# Patient Record
Sex: Male | Born: 1955 | Race: White | Hispanic: No | Marital: Married | Smoking: Never smoker
Health system: Southern US, Community
[De-identification: ages and names within clinical notes are randomized; demographics above are authoritative.]

## PROBLEM LIST (undated history)

## (undated) DIAGNOSIS — IMO0002 Reserved for concepts with insufficient information to code with codable children: Secondary | ICD-10-CM

## (undated) DIAGNOSIS — I10 Essential (primary) hypertension: Secondary | ICD-10-CM

## (undated) DIAGNOSIS — K5641 Fecal impaction: Secondary | ICD-10-CM

## (undated) HISTORY — DX: Reserved for concepts with insufficient information to code with codable children: IMO0002

---

## 2008-12-25 DIAGNOSIS — K5641 Fecal impaction: Secondary | ICD-10-CM

## 2008-12-25 HISTORY — DX: Fecal impaction: K56.41

## 2012-03-13 ENCOUNTER — Encounter (INDEPENDENT_AMBULATORY_CARE_PROVIDER_SITE_OTHER): Payer: Self-pay | Admitting: Surgery

## 2012-03-25 DIAGNOSIS — IMO0002 Reserved for concepts with insufficient information to code with codable children: Secondary | ICD-10-CM

## 2012-03-25 HISTORY — DX: Reserved for concepts with insufficient information to code with codable children: IMO0002

## 2012-04-03 ENCOUNTER — Ambulatory Visit (INDEPENDENT_AMBULATORY_CARE_PROVIDER_SITE_OTHER): Payer: BC Managed Care – PPO | Admitting: Surgery

## 2012-04-08 ENCOUNTER — Ambulatory Visit (INDEPENDENT_AMBULATORY_CARE_PROVIDER_SITE_OTHER): Payer: BC Managed Care – PPO | Admitting: Surgery

## 2012-04-08 ENCOUNTER — Encounter (INDEPENDENT_AMBULATORY_CARE_PROVIDER_SITE_OTHER): Payer: Self-pay | Admitting: Surgery

## 2012-04-08 VITALS — BP 158/98 | HR 72 | Temp 98.5°F | Resp 16 | Ht 69.0 in | Wt 220.8 lb

## 2012-04-08 DIAGNOSIS — IMO0002 Reserved for concepts with insufficient information to code with codable children: Secondary | ICD-10-CM | POA: Insufficient documentation

## 2012-04-08 DIAGNOSIS — L723 Sebaceous cyst: Secondary | ICD-10-CM

## 2012-04-08 NOTE — Progress Notes (Signed)
Subjective:     Patient ID: Joshua Rivas, male   DOB: November 21, 1956, 56 y.o.   MRN: [id number]  HPI He is referred by Dr. Eula Listen for evaluation of a cyst in the left groin. It appeared to proximal. It was painful but is now totally resolved since he saw Dr. Eula Listen. He now has no pain. He denies any trauma. He denies any previous cysts  Review of Systems     Objective:   Physical Exam On exam, there is a small scar in the groin but no palpable cyst or evidence of infection    Assessment:     Resolved left groin cyst    Plan:     As the cyst is no longer present, I will hold on excising the area and less it recurs. I explained the diagnosis to him in detail. He is in agreement with this plan. I will see him back as needed

## 2012-10-06 ENCOUNTER — Encounter (HOSPITAL_COMMUNITY): Payer: Self-pay | Admitting: *Deleted

## 2012-10-06 ENCOUNTER — Emergency Department (HOSPITAL_COMMUNITY)
Admission: EM | Admit: 2012-10-06 | Discharge: 2012-10-06 | Disposition: A | Discharge status: Home / Self Care | Payer: BC Managed Care – PPO | Attending: Emergency Medicine | Admitting: Emergency Medicine

## 2012-10-06 ENCOUNTER — Emergency Department (HOSPITAL_COMMUNITY): Payer: BC Managed Care – PPO

## 2012-10-06 DIAGNOSIS — R1013 Epigastric pain: Secondary | ICD-10-CM | POA: Insufficient documentation

## 2012-10-06 DIAGNOSIS — R112 Nausea with vomiting, unspecified: Secondary | ICD-10-CM | POA: Insufficient documentation

## 2012-10-06 DIAGNOSIS — R109 Unspecified abdominal pain: Secondary | ICD-10-CM

## 2012-10-06 LAB — COMPREHENSIVE METABOLIC PANEL
ALT: 21 U/L (ref 0–53)
AST: 21 U/L (ref 0–37)
Albumin: 3.7 g/dL (ref 3.5–5.2)
CO2: 26 mEq/L (ref 19–32)
Calcium: 9.1 mg/dL (ref 8.4–10.5)
Creatinine, Ser: 1.08 mg/dL (ref 0.50–1.35)
Sodium: 137 mEq/L (ref 135–145)

## 2012-10-06 LAB — URINALYSIS, ROUTINE W REFLEX MICROSCOPIC
Glucose, UA: NEGATIVE mg/dL
Leukocytes, UA: NEGATIVE
Nitrite: NEGATIVE
Specific Gravity, Urine: 1.034 — ABNORMAL HIGH (ref 1.005–1.030)
pH: 5 (ref 5.0–8.0)

## 2012-10-06 LAB — CBC WITH DIFFERENTIAL/PLATELET
Basophils Absolute: 0 10*3/uL (ref 0.0–0.1)
Basophils Relative: 0 % (ref 0–1)
Eosinophils Relative: 0 % (ref 0–5)
HCT: 47.7 % (ref 39.0–52.0)
Lymphocytes Relative: 18 % (ref 12–46)
MCHC: 34 g/dL (ref 30.0–36.0)
MCV: 89.5 fL (ref 78.0–100.0)
Monocytes Absolute: 0.8 10*3/uL (ref 0.1–1.0)
Neutro Abs: 7.6 10*3/uL (ref 1.7–7.7)
Platelets: 247 10*3/uL (ref 150–400)
RDW: 12.3 % (ref 11.5–15.5)
WBC: 10.3 10*3/uL (ref 4.0–10.5)

## 2012-10-06 LAB — TROPONIN I: Troponin I: <0.3 ng/mL (ref <0.30)

## 2012-10-06 MED ORDER — SUCRALFATE 1 GM/10ML PO SUSP: 1.0000 g | Freq: Four times a day (QID) | ORAL | Status: DC | PRN

## 2012-10-06 MED ORDER — GI COCKTAIL ~~LOC~~
30.0000 mL | Freq: Once | ORAL | Status: AC
  Administered 2012-10-06: 30 mL via ORAL
  Filled 2012-10-06: qty 30

## 2012-10-06 MED ORDER — HYDROMORPHONE HCL PF 1 MG/ML IJ SOLN
1.0000 mg | Freq: Once | INTRAMUSCULAR | Status: AC
  Administered 2012-10-06: 1 mg via INTRAVENOUS
  Filled 2012-10-06: qty 1

## 2012-10-06 MED ORDER — ONDANSETRON HCL 4 MG/2ML IJ SOLN
4.0000 mg | Freq: Once | INTRAMUSCULAR | Status: AC
  Administered 2012-10-06: 4 mg via INTRAVENOUS
  Filled 2012-10-06: qty 2

## 2012-10-06 MED ORDER — IOHEXOL 300 MG/ML  SOLN
100.0000 mL | Freq: Once | INTRAMUSCULAR | Status: AC | PRN
  Administered 2012-10-06: 100 mL via INTRAVENOUS

## 2012-10-06 MED ORDER — SODIUM CHLORIDE 0.9 % IV BOLUS (SEPSIS)
1000.0000 mL | Freq: Once | INTRAVENOUS | Status: AC
  Administered 2012-10-06: 1000 mL via INTRAVENOUS

## 2012-10-06 MED ORDER — PANTOPRAZOLE SODIUM 20 MG PO TBEC: 20.0000 mg | DELAYED_RELEASE_TABLET | Freq: Every day | ORAL | Status: DC

## 2012-10-06 NOTE — ED Notes (Signed)
Bed:WA16<BR> Expected date:<BR> Expected time:<BR> Means of arrival:<BR> Comments:<BR> EMS

## 2012-10-06 NOTE — ED Notes (Signed)
Patient is alert and oriented x3.  He is complaining of abdominal pain that started last night after Eating.  He states his pain is a 8 of 10 that radiates straight to his back.

## 2012-10-06 NOTE — ED Provider Notes (Signed)
History     CSN: [id number]  Arrival date & time 10/06/12  0054   First MD Initiated Contact with Patient 10/06/12 0135      Chief Complaint  Patient presents with  . Abdominal Pain    (Consider location/radiation/quality/duration/timing/severity/associated sxs/prior treatment) HPI The patient presents after the sub-acute onset of epigastric pain.  He recalls eating dinner, having half of an alcoholic beverage, and eating some peanuts and subsequently developing sharp pain in his epigastrium.  The pain is nonradiating.  Since onset the pain has been persistent, though minimally better after vomiting.  She notes concurrent nausea, generalized sense of discomfort.  He denies chest pain, dyspnea, fevers, chills. The pain radiates to his back. No relief with OTC antacids. The patient was in his usual state of health until this occurred. Past Medical History  Diagnosis Date  . Cyst     Left groin    History reviewed. No pertinent past surgical history.  History reviewed. No pertinent family history.  History  Substance Use Topics  . Smoking status: Never Smoker   . Smokeless tobacco: Not on file  . Alcohol Use: Yes      Review of Systems  Constitutional:       Per HPI, otherwise negative  HENT:       Per HPI, otherwise negative  Eyes: Negative.   Respiratory:       Per HPI, otherwise negative  Cardiovascular:       Per HPI, otherwise negative  Gastrointestinal: Positive for nausea, vomiting and abdominal pain. Negative for diarrhea.  Genitourinary: Negative.   Musculoskeletal:       Per HPI, otherwise negative  Skin: Negative.   Neurological: Negative for syncope.    Allergies  Review of patient's allergies indicates no known allergies.  Home Medications   Current Outpatient Rx  Name Route Sig Dispense Refill  . ACETAMINOPHEN 325 MG PO TABS Oral Take 650 mg by mouth every 6 (six) hours as needed.    Marland Kitchen BISMUTH SUBSALICYLATE 262 MG PO CHEW Oral Chew 524 mg  by mouth as needed. Stomach pain      BP 148/82  Pulse 86  Temp 97.6 F (36.4 C) (Oral)  Resp 18  SpO2 100%  Physical Exam  Nursing note and vitals reviewed. Constitutional: He is oriented to person, place, and time. He appears well-developed. No distress.  HENT:  Head: Normocephalic and atraumatic.  Eyes: Conjunctivae normal and EOM are normal.  Cardiovascular: Normal rate and regular rhythm.   Pulmonary/Chest: Effort normal. No stridor. No respiratory distress.  Abdominal: He exhibits no distension. There is tenderness in the epigastric area. There is no rigidity and no rebound.  Musculoskeletal: He exhibits no edema.  Neurological: He is alert and oriented to person, place, and time.  Skin: Skin is warm and dry.  Psychiatric: He has a normal mood and affect.    ED Course  Procedures (including critical care time)   Labs Reviewed  CBC WITH DIFFERENTIAL  COMPREHENSIVE METABOLIC PANEL  LIPASE, BLOOD  TROPONIN I  URINALYSIS, ROUTINE W REFLEX MICROSCOPIC   No results found.   No diagnosis found.  Cardiac 80 sinus rhythm normal Pulse ox 99% room air normal  3:26 AM Patient continues to c/o pain.  CT scan ordered.   Date: 10/06/2012  Rate: 84  Rhythm: normal sinus rhythm  QRS Axis: normal  Intervals: normal  ST/T Wave abnormalities: normal  Conduction Disutrbances:none  Narrative Interpretation:   Old EKG Reviewed: none available  NORMAL  ECG    6:22 AM The patient seems comfortable, resting, no apparent distress.  Vital signs remained stable. MDM  This previously well male presents with new acute onset abdominal pain.  The pain is epigastric, with associated nausea and vomiting.  Given the patient's description of intake of food and alcohol prior to the onset of symptoms or suspicion of gastroesophageal irritation.  However, given the absence of significant improvement following initial resuscitative efforts, the patient had a CAT scan.  This scan as well  as the patient's respiratory results were all largely reassuring.  The resolution of symptoms was similarly reassuring.  Absent any chest pain, dyspnea, lightheadedness, persistent nausea and vomiting there is low suspicion for acute ongoing cardiac etiology.  After a prolonged discussion on the need for ongoing primary and possible specialist care, the patient was discharged in stable condition with new PPI, topical analgesic.  Gerhard Munch, MD 10/06/12 [phone]

## 2012-10-11 ENCOUNTER — Emergency Department (HOSPITAL_COMMUNITY): Payer: BC Managed Care – PPO

## 2012-10-11 ENCOUNTER — Encounter (HOSPITAL_COMMUNITY): Payer: Self-pay | Admitting: *Deleted

## 2012-10-11 ENCOUNTER — Encounter (HOSPITAL_COMMUNITY): Payer: Self-pay | Admitting: Anesthesiology

## 2012-10-11 ENCOUNTER — Inpatient Hospital Stay (HOSPITAL_COMMUNITY)
Admission: EM | Admit: 2012-10-11 | Discharge: 2012-10-16 | DRG: 198 | Disposition: A | Discharge status: Home / Self Care | Payer: BC Managed Care – PPO | Attending: Surgery | Admitting: Surgery

## 2012-10-11 ENCOUNTER — Inpatient Hospital Stay (HOSPITAL_COMMUNITY): Payer: BC Managed Care – PPO

## 2012-10-11 ENCOUNTER — Encounter (HOSPITAL_COMMUNITY): Admission: EM | Disposition: A | Discharge status: Home / Self Care | Payer: Self-pay | Source: Home / Self Care

## 2012-10-11 ENCOUNTER — Inpatient Hospital Stay (HOSPITAL_COMMUNITY): Payer: BC Managed Care – PPO | Admitting: Anesthesiology

## 2012-10-11 DIAGNOSIS — K8000 Calculus of gallbladder with acute cholecystitis without obstruction: Secondary | ICD-10-CM

## 2012-10-11 DIAGNOSIS — K8010 Calculus of gallbladder with chronic cholecystitis without obstruction: Secondary | ICD-10-CM

## 2012-10-11 DIAGNOSIS — Z5331 Laparoscopic surgical procedure converted to open procedure: Secondary | ICD-10-CM

## 2012-10-11 DIAGNOSIS — K819 Cholecystitis, unspecified: Secondary | ICD-10-CM

## 2012-10-11 HISTORY — PX: CHOLECYSTECTOMY: SHX55

## 2012-10-11 HISTORY — DX: Reserved for concepts with insufficient information to code with codable children: IMO0002

## 2012-10-11 HISTORY — DX: Fecal impaction: K56.41

## 2012-10-11 LAB — COMPREHENSIVE METABOLIC PANEL
ALT: 16 U/L (ref 0–53)
AST: 15 U/L (ref 0–37)
Albumin: 3.2 g/dL — ABNORMAL LOW (ref 3.5–5.2)
Alkaline Phosphatase: 85 U/L (ref 39–117)
CO2: 23 mEq/L (ref 19–32)
Chloride: 102 mEq/L (ref 96–112)
Creatinine, Ser: 0.97 mg/dL (ref 0.50–1.35)
Potassium: 3.8 mEq/L (ref 3.5–5.1)
Sodium: 136 mEq/L (ref 135–145)
Total Bilirubin: 0.5 mg/dL (ref 0.3–1.2)

## 2012-10-11 LAB — CBC WITH DIFFERENTIAL/PLATELET
HCT: 44.3 % (ref 39.0–52.0)
Hemoglobin: 15.2 g/dL (ref 13.0–17.0)
Lymphocytes Relative: 12 % (ref 12–46)
MCHC: 34.3 g/dL (ref 30.0–36.0)
MCV: 89.3 fL (ref 78.0–100.0)
Monocytes Absolute: 1.3 10*3/uL — ABNORMAL HIGH (ref 0.1–1.0)
Monocytes Relative: 10 % (ref 3–12)
Neutro Abs: 10 10*3/uL — ABNORMAL HIGH (ref 1.7–7.7)
WBC: 13.3 10*3/uL — ABNORMAL HIGH (ref 4.0–10.5)

## 2012-10-11 LAB — URINALYSIS, ROUTINE W REFLEX MICROSCOPIC
Glucose, UA: NEGATIVE mg/dL
Hgb urine dipstick: NEGATIVE
Leukocytes, UA: NEGATIVE
pH: 5.5 (ref 5.0–8.0)

## 2012-10-11 SURGERY — CHOLECYSTECTOMY
Anesthesia: General | Site: Abdomen | Wound class: Contaminated

## 2012-10-11 MED ORDER — LACTATED RINGERS IV SOLN
INTRAVENOUS | Status: DC
Start: 2012-10-11 — End: 2012-10-11

## 2012-10-11 MED ORDER — HYDROMORPHONE 0.3 MG/ML IV SOLN
INTRAVENOUS | Status: AC
  Administered 2012-10-11: 0.3 mg via INTRAVENOUS
  Filled 2012-10-11: qty 25

## 2012-10-11 MED ORDER — ONDANSETRON HCL 4 MG/2ML IJ SOLN: 4.0000 mg | Freq: Four times a day (QID) | INTRAMUSCULAR | Status: DC | PRN

## 2012-10-11 MED ORDER — MORPHINE SULFATE 2 MG/ML IJ SOLN: 1.0000 mg | INTRAMUSCULAR | Status: DC | PRN

## 2012-10-11 MED ORDER — HYDROMORPHONE HCL PF 1 MG/ML IJ SOLN
0.2500 mg | INTRAMUSCULAR | Status: DC | PRN
  Administered 2012-10-11: 0.25 mg via INTRAVENOUS
  Administered 2012-10-11: 0.5 mg via INTRAVENOUS
  Administered 2012-10-11: 0.25 mg via INTRAVENOUS
  Administered 2012-10-11: 0.5 mg via INTRAVENOUS

## 2012-10-11 MED ORDER — SODIUM CHLORIDE 0.9 % IV SOLN
1.0000 g | INTRAVENOUS | Status: DC
  Administered 2012-10-11 – 2012-10-14 (×4): 1 g via INTRAVENOUS
  Filled 2012-10-11 (×6): qty 1

## 2012-10-11 MED ORDER — HYDROMORPHONE HCL PF 1 MG/ML IJ SOLN
INTRAMUSCULAR | Status: AC
  Filled 2012-10-11: qty 1

## 2012-10-11 MED ORDER — HYDROMORPHONE HCL PF 1 MG/ML IJ SOLN
INTRAMUSCULAR | Status: DC | PRN
  Administered 2012-10-11: 0.5 mg via INTRAVENOUS
  Administered 2012-10-11: .5 mg via INTRAVENOUS
  Administered 2012-10-11 (×2): 0.5 mg via INTRAVENOUS

## 2012-10-11 MED ORDER — PROPOFOL 10 MG/ML IV BOLUS
INTRAVENOUS | Status: DC | PRN
  Administered 2012-10-11: 180 mg via INTRAVENOUS

## 2012-10-11 MED ORDER — BUPIVACAINE-EPINEPHRINE 0.25% -1:200000 IJ SOLN
INTRAMUSCULAR | Status: DC | PRN
  Administered 2012-10-11: 24 mL

## 2012-10-11 MED ORDER — IOHEXOL 300 MG/ML  SOLN
INTRAMUSCULAR | Status: AC
  Filled 2012-10-11: qty 1

## 2012-10-11 MED ORDER — LACTATED RINGERS IR SOLN
Status: DC | PRN
  Administered 2012-10-11: 1000 mL

## 2012-10-11 MED ORDER — CEFAZOLIN SODIUM 1-5 GM-% IV SOLN
INTRAVENOUS | Status: AC
  Filled 2012-10-11: qty 50

## 2012-10-11 MED ORDER — MIDAZOLAM HCL 5 MG/5ML IJ SOLN
INTRAMUSCULAR | Status: DC | PRN
  Administered 2012-10-11: 2 mg via INTRAVENOUS

## 2012-10-11 MED ORDER — SODIUM CHLORIDE 0.9 % IV SOLN
1000.0000 mL | INTRAVENOUS | Status: DC
  Administered 2012-10-11: 1000 mL via INTRAVENOUS

## 2012-10-11 MED ORDER — ONDANSETRON HCL 4 MG PO TABS: 4.0000 mg | ORAL_TABLET | Freq: Four times a day (QID) | ORAL | Status: DC | PRN

## 2012-10-11 MED ORDER — LACTATED RINGERS IV SOLN
INTRAVENOUS | Status: DC
  Administered 2012-10-11 (×2): via INTRAVENOUS
  Administered 2012-10-11: 1000 mL via INTRAVENOUS

## 2012-10-11 MED ORDER — 0.9 % SODIUM CHLORIDE (POUR BTL) OPTIME
TOPICAL | Status: DC | PRN
  Administered 2012-10-11 (×3): 1000 mL

## 2012-10-11 MED ORDER — PROMETHAZINE HCL 25 MG/ML IJ SOLN: 6.2500 mg | INTRAMUSCULAR | Status: DC | PRN

## 2012-10-11 MED ORDER — ONDANSETRON HCL 4 MG/2ML IJ SOLN
INTRAMUSCULAR | Status: DC | PRN
  Administered 2012-10-11: 4 mg via INTRAVENOUS

## 2012-10-11 MED ORDER — CHLORHEXIDINE GLUCONATE CLOTH 2 % EX PADS: 6.0000 | MEDICATED_PAD | Freq: Every day | CUTANEOUS | Status: DC

## 2012-10-11 MED ORDER — ACETAMINOPHEN 10 MG/ML IV SOLN
INTRAVENOUS | Status: DC | PRN
  Administered 2012-10-11: 1000 mg via INTRAVENOUS

## 2012-10-11 MED ORDER — CEFAZOLIN SODIUM 1-5 GM-% IV SOLN
1.0000 g | Freq: Three times a day (TID) | INTRAVENOUS | Status: DC
  Administered 2012-10-11 (×2): 1 g via INTRAVENOUS
  Filled 2012-10-11 (×4): qty 50

## 2012-10-11 MED ORDER — BUPIVACAINE-EPINEPHRINE PF 0.25-1:200000 % IJ SOLN
INTRAMUSCULAR | Status: AC
  Filled 2012-10-11: qty 30

## 2012-10-11 MED ORDER — SODIUM CHLORIDE 0.9 % IJ SOLN: 9.0000 mL | INTRAMUSCULAR | Status: DC | PRN

## 2012-10-11 MED ORDER — BUPIVACAINE LIPOSOME 1.3 % IJ SUSP
20.0000 mL | Freq: Once | INTRAMUSCULAR | Status: AC
  Administered 2012-10-11: 20 mL
  Filled 2012-10-11 (×2): qty 20

## 2012-10-11 MED ORDER — NALOXONE HCL 0.4 MG/ML IJ SOLN: 0.4000 mg | INTRAMUSCULAR | Status: DC | PRN

## 2012-10-11 MED ORDER — ROCURONIUM BROMIDE 100 MG/10ML IV SOLN
INTRAVENOUS | Status: DC | PRN
  Administered 2012-10-11: 50 mg via INTRAVENOUS
  Administered 2012-10-11: 10 mg via INTRAVENOUS

## 2012-10-11 MED ORDER — OXYCODONE-ACETAMINOPHEN 5-325 MG PO TABS
1.0000 | ORAL_TABLET | ORAL | Status: DC | PRN
  Administered 2012-10-12: 2 via ORAL
  Administered 2012-10-12: 1 via ORAL
  Administered 2012-10-12 – 2012-10-15 (×15): 2 via ORAL
  Administered 2012-10-15 (×2): 1 via ORAL
  Administered 2012-10-16 (×2): 2 via ORAL
  Filled 2012-10-11 (×12): qty 2
  Filled 2012-10-11: qty 1
  Filled 2012-10-11 (×7): qty 2

## 2012-10-11 MED ORDER — PANTOPRAZOLE SODIUM 40 MG PO TBEC
40.0000 mg | DELAYED_RELEASE_TABLET | Freq: Every day | ORAL | Status: DC
  Administered 2012-10-11 – 2012-10-16 (×6): 40 mg via ORAL
  Filled 2012-10-11 (×6): qty 1

## 2012-10-11 MED ORDER — HYDROMORPHONE 0.3 MG/ML IV SOLN
INTRAVENOUS | Status: DC
  Administered 2012-10-11: 0.9 mg via INTRAVENOUS
  Administered 2012-10-12: 3 mg via INTRAVENOUS
  Administered 2012-10-12: 1.8 mg via INTRAVENOUS
  Administered 2012-10-12: 1.5 mg via INTRAVENOUS

## 2012-10-11 MED ORDER — NEOSTIGMINE METHYLSULFATE 1 MG/ML IJ SOLN
INTRAMUSCULAR | Status: DC | PRN
  Administered 2012-10-11: 5 mg via INTRAVENOUS

## 2012-10-11 MED ORDER — KCL IN DEXTROSE-NACL 20-5-0.45 MEQ/L-%-% IV SOLN
INTRAVENOUS | Status: DC
  Administered 2012-10-11: 10:00:00 via INTRAVENOUS
  Filled 2012-10-11 (×4): qty 1000

## 2012-10-11 MED ORDER — HEPARIN SODIUM (PORCINE) 5000 UNIT/ML IJ SOLN
5000.0000 [IU] | Freq: Three times a day (TID) | INTRAMUSCULAR | Status: DC
  Administered 2012-10-12 – 2012-10-16 (×12): 5000 [IU] via SUBCUTANEOUS
  Filled 2012-10-11 (×17): qty 1

## 2012-10-11 MED ORDER — DIPHENHYDRAMINE HCL 50 MG/ML IJ SOLN: 12.5000 mg | Freq: Four times a day (QID) | INTRAMUSCULAR | Status: DC | PRN

## 2012-10-11 MED ORDER — ACETAMINOPHEN 10 MG/ML IV SOLN
INTRAVENOUS | Status: AC
  Filled 2012-10-11: qty 100

## 2012-10-11 MED ORDER — DIPHENHYDRAMINE HCL 12.5 MG/5ML PO ELIX: 12.5000 mg | ORAL_SOLUTION | Freq: Four times a day (QID) | ORAL | Status: DC | PRN

## 2012-10-11 MED ORDER — LIDOCAINE HCL (CARDIAC) 20 MG/ML IV SOLN
INTRAVENOUS | Status: DC | PRN
  Administered 2012-10-11: 80 mg via INTRAVENOUS

## 2012-10-11 MED ORDER — GLYCOPYRROLATE 0.2 MG/ML IJ SOLN
INTRAMUSCULAR | Status: DC | PRN
  Administered 2012-10-11: .8 mg via INTRAVENOUS

## 2012-10-11 MED ORDER — KCL IN DEXTROSE-NACL 20-5-0.9 MEQ/L-%-% IV SOLN
INTRAVENOUS | Status: DC
  Administered 2012-10-11 – 2012-10-16 (×9): via INTRAVENOUS
  Filled 2012-10-11 (×10): qty 1000

## 2012-10-11 MED ORDER — FENTANYL CITRATE 0.05 MG/ML IJ SOLN
INTRAMUSCULAR | Status: DC | PRN
  Administered 2012-10-11 (×2): 100 ug via INTRAVENOUS
  Administered 2012-10-11: 50 ug via INTRAVENOUS

## 2012-10-11 MED ORDER — SODIUM CHLORIDE 0.9 % IJ SOLN
INTRAMUSCULAR | Status: DC | PRN
  Administered 2012-10-11: 20 mL via INTRAVENOUS

## 2012-10-11 MED ORDER — SODIUM CHLORIDE 0.9 % IV SOLN
1000.0000 mL | Freq: Once | INTRAVENOUS | Status: AC
  Administered 2012-10-11: 1000 mL via INTRAVENOUS

## 2012-10-11 SURGICAL SUPPLY — 50 items
APPLIER CLIP ROT 10 11.4 M/L (STAPLE) ×3
BENZOIN TINCTURE PRP APPL 2/3 (GAUZE/BANDAGES/DRESSINGS) IMPLANT
BLADE HEX COATED 2.75 (ELECTRODE) ×3 IMPLANT
CANISTER SUCTION 2500CC (MISCELLANEOUS) ×3 IMPLANT
CATH REDDICK CHOLANGI 4FR 50CM (CATHETERS) IMPLANT
CLIP APPLIE ROT 10 11.4 M/L (STAPLE) ×2 IMPLANT
CLOTH BEACON ORANGE TIMEOUT ST (SAFETY) ×3 IMPLANT
COVER MAYO STAND STRL (DRAPES) ×3 IMPLANT
DECANTER SPIKE VIAL GLASS SM (MISCELLANEOUS) ×3 IMPLANT
DERMABOND ADVANCED (GAUZE/BANDAGES/DRESSINGS) ×1
DERMABOND ADVANCED .7 DNX12 (GAUZE/BANDAGES/DRESSINGS) ×2 IMPLANT
DRAPE C-ARM 42X72 X-RAY (DRAPES) ×3 IMPLANT
DRAPE LAPAROSCOPIC ABDOMINAL (DRAPES) ×3 IMPLANT
DRAPE WARM FLUID 44X44 (DRAPE) ×3 IMPLANT
DRSG PAD ABDOMINAL 8X10 ST (GAUZE/BANDAGES/DRESSINGS) ×3 IMPLANT
ELECT BLADE 6.5 EXT (BLADE) ×3 IMPLANT
ELECT REM PT RETURN 9FT ADLT (ELECTROSURGICAL) ×3
ELECTRODE REM PT RTRN 9FT ADLT (ELECTROSURGICAL) ×2 IMPLANT
GLOVE BIOGEL PI IND STRL 7.0 (GLOVE) ×2 IMPLANT
GLOVE BIOGEL PI INDICATOR 7.0 (GLOVE) ×1
GLOVE SS BIOGEL STRL SZ 7.5 (GLOVE) ×2 IMPLANT
GLOVE SUPERSENSE BIOGEL SZ 7.5 (GLOVE) ×1
GOWN STRL NON-REIN LRG LVL3 (GOWN DISPOSABLE) ×6 IMPLANT
GOWN STRL REIN XL XLG (GOWN DISPOSABLE) ×18 IMPLANT
HEMOSTAT SURGICEL 4X8 (HEMOSTASIS) IMPLANT
IV CATH 14GX2 1/4 (CATHETERS) IMPLANT
IV SET EXT 30 76VOL 4 MALE LL (IV SETS) IMPLANT
KIT BASIN OR (CUSTOM PROCEDURE TRAY) ×3 IMPLANT
NS IRRIG 1000ML POUR BTL (IV SOLUTION) ×9 IMPLANT
PENCIL BUTTON HOLSTER BLD 10FT (ELECTRODE) ×3 IMPLANT
POUCH SPECIMEN RETRIEVAL 10MM (ENDOMECHANICALS) ×3 IMPLANT
SET CHOLANGIOGRAPH MIX (MISCELLANEOUS) ×3 IMPLANT
SET IRRIG TUBING LAPAROSCOPIC (IRRIGATION / IRRIGATOR) ×3 IMPLANT
SLEEVE Z-THREAD 5X100MM (TROCAR) ×3 IMPLANT
SOLUTION ANTI FOG 6CC (MISCELLANEOUS) ×3 IMPLANT
SPONGE GAUZE 4X4 12PLY (GAUZE/BANDAGES/DRESSINGS) ×3 IMPLANT
SPONGE LAP 18X18 X RAY DECT (DISPOSABLE) ×6 IMPLANT
STAPLER VISISTAT 35W (STAPLE) ×3 IMPLANT
STOPCOCK K 69 2C6206 (IV SETS) IMPLANT
STRIP CLOSURE SKIN 1/2X4 (GAUZE/BANDAGES/DRESSINGS) IMPLANT
SUT MNCRL AB 4-0 PS2 18 (SUTURE) ×3 IMPLANT
SUT PDS AB 0 CT1 36 (SUTURE) ×12 IMPLANT
TAPE CLOTH SURG 4X10 WHT LF (GAUZE/BANDAGES/DRESSINGS) ×3 IMPLANT
TOWEL OR 17X26 10 PK STRL BLUE (TOWEL DISPOSABLE) ×6 IMPLANT
TRAY LAP CHOLE (CUSTOM PROCEDURE TRAY) ×3 IMPLANT
TROCAR HASSON GELL 12X100 (TROCAR) ×3 IMPLANT
TROCAR Z-THREAD FIOS 11X100 BL (TROCAR) ×3 IMPLANT
TROCAR Z-THREAD FIOS 5X100MM (TROCAR) ×3 IMPLANT
TUBING INSUFFLATION 10FT LAP (TUBING) ×3 IMPLANT
YANKAUER SUCT BULB TIP 10FT TU (MISCELLANEOUS) ×3 IMPLANT

## 2012-10-11 NOTE — Progress Notes (Signed)
Pt transported to OR. dph

## 2012-10-11 NOTE — Consult Note (Signed)
Re:   Joshua Rivas DOB:   [birth year] MRN:   [id number]  ASSESSMENT AND PLAN: 1.  Cholecystitis with cholelithiasis.  Probable impacted stone in neck of gall bladder.  I discussed with the patient the indications and risks of gall bladder surgery.  The primary risks of gall bladder surgery include, but are not limited to, bleeding, infection, common bile duct injury, and open surgery.  There is also the risk that the patient may have continued symptoms after surgery.  However, the likelihood of improvement in symptoms and return to the patient's normal status is good. We discussed the typical post-operative recovery course. I tried to answer the patient's questions.  I talked about our "surgeon of the week" and that he may or may not get his surgery today.  He understands this.  The other option is to let him go home and try to schedule him next week.   Chief Complaint  Patient presents with  . Abdominal Pain   REFERRING PHYSICIAN:  Dr. Jinger Neighbors, [facility]  HISTORY OF PRESENT ILLNESS: Joshua Rivas is a 56 y.o. (DOB: 1956-06-03)  white male whose primary care physician is HUSAIN,KARRAR, MD and comes to the [facility] ER for persistent abdominal pain.  He has no family history of gall bladder disease.  He is with his wife.  They moved here from [place] about 1 year ago.  He ate chinese food last weekend, developed epigastric pain, came to the [facility] 10/06/2012.  The evaluation did not really find a cause.  In retrospect, his CT scan showed gall layering sludge.  He flew to [facility] for a meeting Sunday, but stayed in bed for about 2 days.  He had not eaten much until Wednesday night when he went out and ate 1/2 hamburger.  He had worsening abdominal pain, more RUQ, and went to the [facility] ER.  But because it was filthy and he had to wait over 4 hours, he was never seen.  He flew home and because of persistent pain, came back to the [facility] last PM. He has had no fever, no nausea or vomiting,  though he has no appetite.  He has mild constipation.  CT - 10/06/2012 - gall bladder layering sludge [place] - 10/11/2012 - non mobile stones in the neck of the gall bladder   Past Medical History  Diagnosis Date  . Cyst     Left groin     History reviewed. No pertinent past surgical history.    Current Facility-Administered Medications  Medication Dose Route Frequency Provider Last Rate Last Dose  . 0.9 %  sodium chloride infusion  1,000 mL Intravenous Once Celene Kras, MD 999 mL/hr at 10/11/12 0340 1,000 mL at 10/11/12 0340   Followed by  . 0.9 %  sodium chloride infusion  1,000 mL Intravenous Continuous Celene Kras, MD 125 mL/hr at 10/11/12 0510 1,000 mL at 10/11/12 0510   Current Outpatient Prescriptions  Medication Sig Dispense Refill  . ibuprofen (ADVIL,MOTRIN) 200 MG tablet Take 400 mg by mouth every 6 (six) hours as needed. For pain      . OVER THE COUNTER MEDICATION Take 1 Bottle by mouth once. Otc. [facility] brand gentle laxative.      . pantoprazole (PROTONIX) 20 MG tablet Take 1 tablet (20 mg total) by mouth daily.  30 tablet  0  . sucralfate (CARAFATE) 1 GM/10ML suspension Take 6 g by mouth 3 (three) times daily as needed. For abdominal pain  No Known Allergies  REVIEW OF SYSTEMS: Skin:  No history of rash.  No history of abnormal moles. Infection:  No history of hepatitis or HIV.  No history of MRSA. Neurologic:  No history of stroke.  No history of seizure.  No history of headaches. Cardiac:  No history of hypertension. No history of heart disease.  No history of prior cardiac catheterization.  No history of seeing a cardiologist. Pulmonary:  Does not smoke cigarettes.  No asthma or bronchitis.  No OSA/CPAP.  Endocrine:  No diabetes. No thyroid disease. Gastrointestinal:  No history of stomach disease.  No history of liver disease.  No history of gall bladder disease.  No history of pancreas disease.  No history of colon disease. Urologic:  No history of kidney  stones.  No history of bladder infections. Musculoskeletal:  No history of joint or back disease. Hematologic:  No bleeding disorder.  No history of anemia.  Not anticoagulated. Psycho-social:  The patient is oriented.   The patient has no obvious psychologic or social impairment to understanding our conversation and plan.  SOCIAL and FAMILY HISTORY: Married. Works for [employer] in [place].  PHYSICAL EXAM: BP 139/83  Pulse 108  Temp 98.6 F (37 C)  Resp 20  SpO2 96%  General: WN WM who is alert and generally healthy appearing.  HEENT: Normal. Pupils equal. Neck: Supple. No mass.  No thyroid mass. Lymph Nodes:  No supraclavicular or cervical nodes. Lungs: Clear to auscultation and symmetric breath sounds. Heart:  RRR. No murmur or rub.  Abdomen: Soft. No mass. No hernia. Normal bowel sounds.  No abdominal scars. Mild RUQ discomfort. Rectal: Not done. Extremities:  Good strength and ROM  in upper and lower extremities. Neurologic:  Grossly intact to motor and sensory function. Psychiatric: Has normal mood and affect. Behavior is normal.   DATA REVIEWED: WBC 13,300, Alk phos - 85, T. Bili - 0.5, Lipase - 28 - 10/11/2012  Ovidio Kin, MD,  [facility] [address]  [address]   [place], [place]    [address] Phone:  [phone] FAX:  [phone]

## 2012-10-11 NOTE — Transfer of Care (Signed)
Immediate Anesthesia Transfer of Care Note  Patient: Joshua Rivas  Procedure(s) Performed: Procedure(s) (LRB) with comments: CHOLECYSTECTOMY (N/A) - laparoscopic to open cholecystectomy  Patient Location: PACU  Anesthesia Type: General  Level of Consciousness: awake, alert  and oriented  Airway & Oxygen Therapy: Patient Spontanous Breathing and Patient connected to face mask oxygen  Post-op Assessment: Report given to PACU RN and Post -op Vital signs reviewed and stable  Post vital signs: Reviewed and stable  Complications: No apparent anesthesia complications

## 2012-10-11 NOTE — ED Notes (Signed)
Report given to [facility]. Pt awaiting transport to floor

## 2012-10-11 NOTE — ED Provider Notes (Signed)
History    CSN: [id number] Arrival date & time 10/11/12  1478 First MD Initiated Contact with Patient 10/11/12 0302    Chief Complaint  Patient presents with  . Abdominal Pain   HPI Patient presents to the emergency room with persistent upper abdominal pain. He was initially seen in the emergency room on October 13 for difficulty with epigastric pain after eating dinner. The pain was in the upper abdomen, nonradiating with associated nausea and vomiting. He was seen in the emergency room and had unremarkable laboratory tests. A CT scan was performed that showed gallbladder sludge but no other abnormalities. Patient had a planned business trip to [place]. Patient just returned yesterday. He noted that while he was in [place] he had some recurrent episodes of pain after eating a meal consisting of hamburgers and tater tot. The pain was severe enough that he was going to go to an emergency room in [place] but the wait was too long and ended up leaving.  This evening the patient had another episode of pain although it is essentially resolved at this point. Tonight he has not had any nausea or vomiting.  He denies fevers. He denies chest pain or back pain.  No diarrhea. He hasn't had a good bowel movement in a few days Past Medical History  Diagnosis Date  . Cyst     Left groin    History reviewed. No pertinent past surgical history.  No family history on file.  History  Substance Use Topics  . Smoking status: Never Smoker   . Smokeless tobacco: Not on file  . Alcohol Use: Yes      Review of Systems  All other systems reviewed and are negative.    Allergies  Review of patient's allergies indicates no known allergies.  Home Medications   Current Outpatient Rx  Name Route Sig Dispense Refill  . ACETAMINOPHEN 325 MG PO TABS Oral Take 650 mg by mouth every 6 (six) hours as needed.    Marland Kitchen BISMUTH SUBSALICYLATE 262 MG PO CHEW Oral Chew 524 mg by mouth as needed. Stomach pain    .  PANTOPRAZOLE SODIUM 20 MG PO TBEC Oral Take 1 tablet (20 mg total) by mouth daily. 30 tablet 0  . SUCRALFATE 1 GM/10ML PO SUSP Oral Take 10 mLs (1 g total) by mouth every 6 (six) hours as needed (abdominal pain). 420 mL 0    BP 139/83  Pulse 108  Temp 98.6 F (37 C)  Resp 20  SpO2 96%  Physical Exam  Nursing note and vitals reviewed. Constitutional: He appears well-developed and well-nourished. No distress.  HENT:  Head: Normocephalic and atraumatic.  Right Ear: External ear normal.  Left Ear: External ear normal.  Eyes: Conjunctivae normal are normal. Right eye exhibits no discharge. Left eye exhibits no discharge. No scleral icterus.  Neck: Neck supple. No tracheal deviation present.  Cardiovascular: Normal rate, regular rhythm and intact distal pulses.   Pulmonary/Chest: Effort normal and breath sounds normal. No stridor. No respiratory distress. He has no wheezes. He has no rales.  Abdominal: Soft. Bowel sounds are normal. He exhibits no distension. There is tenderness (right upper quadrant mild tenderness). There is no rebound and no guarding.  Musculoskeletal: He exhibits no edema and no tenderness.  Neurological: He is alert. He has normal strength. No sensory deficit. Cranial nerve deficit:  no gross defecits noted. He exhibits normal muscle tone. He displays no seizure activity. Coordination normal.  Skin: Skin is warm and dry. No  rash noted.  Psychiatric: He has a normal mood and affect.    ED Course  Procedures (including critical care time)  Labs Reviewed  COMPREHENSIVE METABOLIC PANEL - Abnormal; Notable for the following:    Glucose, Bld 110 (*)     Albumin 3.2 (*)     All other components within normal limits  URINALYSIS, ROUTINE W REFLEX MICROSCOPIC - Abnormal; Notable for the following:    Ketones, ur TRACE (*)     All other components within normal limits  CBC WITH DIFFERENTIAL - Abnormal; Notable for the following:    WBC 13.3 (*)     Neutro Abs 10.0 (*)      Monocytes Absolute 1.3 (*)     All other components within normal limits  LIPASE, BLOOD   US Abdomen Complete  10/11/2012  *RADIOLOGY REPORT*  Clinical Data:  Right upper quadrant pain.  COMPLETE ABDOMINAL ULTRASOUND  Comparison:  CT abdomen and pelvis 10/06/2012  Findings:  Gallbladder:  Non mobile stones in the gallbladder neck.  Mild gallbladder sludge.  No gallbladder wall thickening.  Small amount of pericholecystic fluid.  Murphy's sign is positive.  Changes are nonspecific but consistent with acute cholecystitis in the appropriate clinical setting.  Common bile duct:  Normal caliber with measured diameter of 3.1 mm.  Liver:  No focal lesion identified.  Within normal limits in parenchymal echogenicity.  IVC:  Not well visualized.  Pancreas:  The pancreas is not well visualized.  Spleen:  Spleen length measures 13 cm.  Normal parenchymal echotexture.  Right Kidney:  Right kidney measures 11.3 cm length.  No hydronephrosis.  Left Kidney:  Left kidney measures 12.6 cm length.  No hydronephrosis.  Abdominal aorta:  Abdominal aorta is not well visualized.  IMPRESSION: Non mobile stones in the gallbladder neck with gallbladder sludge and positive Murphy's sign.  Changes are consistent with acute cholecystitis in the appropriate clinical setting.   Original Report Authenticated By: Marlon Pel, M.D.      1. Cholecystitis       MDM  The patient has been having persistent low-grade symptoms over the last several days. The ultrasound shows gallstones associated with gallbladder sludge and a positive Murphy's sign. Patient does have a mild leukocytosis and I suspect his symptoms are related to cholecystitis . I will consult general surgery        Celene Kras, MD 10/11/12 [phone]

## 2012-10-11 NOTE — ED Notes (Signed)
Pt c/o right upper quad pain; evaluated last wk for same--took a trip to [place]; developed abd pain last night--again tonight; increased pain with inspiration

## 2012-10-11 NOTE — Anesthesia Postprocedure Evaluation (Signed)
Anesthesia Post Note  Patient: Joshua Rivas  Procedure(s) Performed: Procedure(s) (LRB): CHOLECYSTECTOMY (N/A)  Anesthesia type: General  Patient location: PACU  Post pain: Pain level controlled  Post assessment: Post-op Vital signs reviewed  Last Vitals:  Filed Vitals:   10/11/12 1820  BP: 152/82  Pulse: 97  Temp: 37.2 C  Resp: 12    Post vital signs: Reviewed  Level of consciousness: sedated  Complications: No apparent anesthesia complications

## 2012-10-11 NOTE — Op Note (Signed)
Preoperative Diagnosis: Cholelithiasis and cholecystitis  Postoprative Diagnosis: Same  Procedure: Procedure(s): LAPAROSCOPIC CONVERTED TO OPEN CHOLECYSTECTOMY   Surgeon: Mariella Saa   Assistants: Streck  Anesthesia:  General endotracheal anesthesiaDiagnos  Indications:   Patient is a 56 year old male who presents with approximately one week of persistent right upper quadrant and flank pain. He has had a gallbladder ultrasound showing nonmobile gallstones in the neck of the gallbladder. He has localized tenderness. The patient was admitted urgently last night and cholecystectomy recommended.  Procedure Detail:  Patient was brought to the operating room, placed in the supine position on the operating table, general endotracheal anesthesia induced. The abdomen was widely sterilely prepped and draped. He was already on IV antibiotics. PAS were in place. Patient timeout was performed and correct patient and procedure verified. Access was obtained with a 1 cm incision at the umbilicus and dissected through the midline fascia which was incised for 1 cm the peritoneum entered under direct vision. Through a mattress suture of 0 Vicryl the subcutaneous port was placed and pneumoperitoneum established. Under direct vision a 10 mm trocar was placed in the subxiphoid area and 25 mm trochars along the right subcostal margin. The omentum was adherent to the gallbladder and it was stripped down exposing the fundus which was severely acutely and subacutely inflamed with some gangrenous changes. The gallbladder was tense it was decompressed with an aspiration needle. The fundus was unable to be grasped and elevated. There were extensive omental adhesions to the wall of the gallbladder which were slowly taken down with careful blunt and cautery dissection staying on the wall of the gallbladder. The dissection was very tedious due to severe subacute adhesions. As I dissected down toward the infundibulum I  clearly identified the cystic artery coursing up onto the anterior gallbladder wall which was divided between 2 proximal one distal clip. The infundibulum was then grasped retracted inferolaterally and while continuing further careful blunt dissection on the gallbladder wall the infundibulum toward being very poor partially necrotic tissue. A couple of large stones spilled which were immediately removed. At this point I did not feel that continuing laparoscopically was safe or feasible and elected to convert to open. A subcostal incision was made incorporating trocar sites and dissection carried down to subcutaneous tissue and fascial and muscle layers with cautery. A Balfour retractor was placed and with the surgeon and assistants packing the viscera inferiorly the gallbladder was well exposed. I then took the gallbladder down from top down with cautery and with careful blunt dissection with the posterior wall of the gallbladder being gangrenous. It dissected away from the liver relatively well and we continued to slowly dissect distally on the gallbladder wall until I came down to the opening in the infundibulum which was clearly still up on the gallbladder and there was an impacted stone just beyond this toward the porta hepatis. We continued with careful blunt dissection right along the gallbladder wall and I did remove the stone and then we could identify the cystic duct gallbladder junction although it was still very inflamed and adherent in this area. I isolated the cystic duct gallbladder junction and we felt confident that this was away from the porta hepatis and this was clamped and the gallbladder removed. The cystic duct was tied with a 2-0 silk. The right upper quadrant was then thoroughly your gated and hemostasis obtained. A closed suction drain was brought out through a separate stab wound and left in Morison's pouch. The wound was then closed  in layers with running 0 PDS after infiltrating the  muscle and fascial layers with Exparel local anesthetic. The subcutaneous tissue was irrigated and the skin closed with staples. Sponge needle and sponge counts were correct.  Findings: Severe subacute and gangrenous cholecystitis  Estimated Blood Loss:  300 mL         Drains: JACKSON-PRATT (JP)  Blood Given: none          Specimens: gallbladder and contents        Complications:  * No complications entered in OR log *         Disposition: PACU - hemodynamically stable.         Condition: stable  Mariella Saa MD, FACS  10/11/2012, 6:28 PM

## 2012-10-11 NOTE — Anesthesia Preprocedure Evaluation (Addendum)
Anesthesia Evaluation  Patient identified by MRN, date of birth, ID band Patient awake    Reviewed: Allergy & Precautions, H&P , NPO status , Patient's Chart, lab work & pertinent test results  Airway Mallampati: II TM Distance: >3 FB Neck ROM: Full    Dental  (+) Teeth Intact and Dental Advisory Given   Pulmonary neg pulmonary ROS,  breath sounds clear to auscultation  Pulmonary exam normal       Cardiovascular negative cardio ROS  Rhythm:Regular Rate:Normal     Neuro/Psych negative neurological ROS  negative psych ROS   GI/Hepatic negative GI ROS, Neg liver ROS,   Endo/Other  negative endocrine ROS  Renal/GU negative Renal ROS  negative genitourinary   Musculoskeletal negative musculoskeletal ROS (+)   Abdominal   Peds  Hematology negative hematology ROS (+)   Anesthesia Other Findings   Reproductive/Obstetrics negative OB ROS                           Anesthesia Physical Anesthesia Plan  ASA: I  Anesthesia Plan: General   Post-op Pain Management:    Induction: Intravenous  Airway Management Planned: Oral ETT  Additional Equipment:   Intra-op Plan:   Post-operative Plan: Extubation in OR  Informed Consent: I have reviewed the patients History and Physical, chart, labs and discussed the procedure including the risks, benefits and alternatives for the proposed anesthesia with the patient or authorized representative who has indicated his/her understanding and acceptance.   Dental advisory given  Plan Discussed with: CRNA  Anesthesia Plan Comments:         Anesthesia Quick Evaluation  

## 2012-10-12 LAB — COMPREHENSIVE METABOLIC PANEL
ALT: 35 U/L (ref 0–53)
AST: 39 U/L — ABNORMAL HIGH (ref 0–37)
Calcium: 8.3 mg/dL — ABNORMAL LOW (ref 8.4–10.5)
Creatinine, Ser: 0.88 mg/dL (ref 0.50–1.35)
GFR calc Af Amer: >90 mL/min (ref >90)
Sodium: 137 mEq/L (ref 135–145)
Total Protein: 5.9 g/dL — ABNORMAL LOW (ref 6.0–8.3)

## 2012-10-12 LAB — CBC
HCT: 41 % (ref 39.0–52.0)
Hemoglobin: 13.2 g/dL (ref 13.0–17.0)
MCH: 29.6 pg (ref 26.0–34.0)
MCHC: 32.2 g/dL (ref 30.0–36.0)
RBC: 4.46 MIL/uL (ref 4.22–5.81)

## 2012-10-12 MED ORDER — HYDROMORPHONE HCL PF 1 MG/ML IJ SOLN
1.0000 mg | INTRAMUSCULAR | Status: DC | PRN
  Administered 2012-10-12 (×3): 1 mg via INTRAVENOUS
  Filled 2012-10-12 (×4): qty 1

## 2012-10-12 NOTE — Progress Notes (Signed)
Patient ID: Joshua Rivas, male   DOB: 10-01-1956, 56 y.o.   MRN: [id number] 1 Day Post-Op  Subjective: Some expected incisional pain, no other C/O.  Has been up OOB. No nausea  Objective: Vital signs in last 24 hours: Temp:  [97.5 F (36.4 C)-98.9 F (37.2 C)] 97.6 F (36.4 C) (10/19 0500) Pulse Rate:  [52-97] 57  (10/19 0500) Resp:  [12-19] 18  (10/19 0500) BP: (112-152)/(58-87) 115/73 mmHg (10/19 0500) SpO2:  [94 %-100 %] 99 % (10/19 0500) FiO2 (%):  [35 %] 35 % (10/18 2000) Weight:  [215 lb (97.523 kg)] 215 lb (97.523 kg) (10/18 0931) Last BM Date: 10/09/12 (PTA)  Intake/Output from previous day: 10/18 0701 - 10/19 0700 In: 4174.2 [P.O.:720; I.V.:3404.2; IV Piggyback:50] Out: 1320 [Urine:950; Drains:70; Blood:300] Intake/Output this shift:    General appearance: alert and no distress Resp: clear to auscultation bilaterally GI: + BS mild incisional tenderness Incision/Wound: Clean and dry, JP srosanguinous  Lab Results:   Basename 10/12/12 0403 10/11/12 0320  WBC 13.4* 13.3*  HGB 13.2 15.2  HCT 41.0 44.3  PLT 260 267   BMET  Basename 10/12/12 0403 10/11/12 0320  NA 137 136  K 4.5 3.8  CL 103 102  CO2 26 23  GLUCOSE 147* 110*  BUN 8 14  CREATININE 0.88 0.97  CALCIUM 8.3* 9.0   Lab Results  Component Value Date   ALT 35 10/12/2012   AST 39* 10/12/2012   ALKPHOS 74 10/12/2012   BILITOT 0.3 10/12/2012  gf  Studies/Results: US Abdomen Complete  10/11/2012  *RADIOLOGY REPORT*  Clinical Data:  Right upper quadrant pain.  COMPLETE ABDOMINAL ULTRASOUND  Comparison:  CT abdomen and pelvis 10/06/2012  Findings:  Gallbladder:  Non mobile stones in the gallbladder neck.  Mild gallbladder sludge.  No gallbladder wall thickening.  Small amount of pericholecystic fluid.  Murphy's sign is positive.  Changes are nonspecific but consistent with acute cholecystitis in the appropriate clinical setting.  Common bile duct:  Normal caliber with measured diameter of 3.1 mm.   Liver:  No focal lesion identified.  Within normal limits in parenchymal echogenicity.  IVC:  Not well visualized.  Pancreas:  The pancreas is not well visualized.  Spleen:  Spleen length measures 13 cm.  Normal parenchymal echotexture.  Right Kidney:  Right kidney measures 11.3 cm length.  No hydronephrosis.  Left Kidney:  Left kidney measures 12.6 cm length.  No hydronephrosis.  Abdominal aorta:  Abdominal aorta is not well visualized.  IMPRESSION: Non mobile stones in the gallbladder neck with gallbladder sludge and positive Murphy's sign.  Changes are consistent with acute cholecystitis in the appropriate clinical setting.   Original Report Authenticated By: Joshua Rivas, M.D.     Anti-infectives: Anti-infectives     Start     Dose/Rate Route Frequency Ordered Stop   10/11/12 2200   ertapenem (INVANZ) 1 g in sodium chloride 0.9 % 50 mL IVPB        1 g 100 mL/hr over 30 Minutes Intravenous Every 24 hours 10/11/12 2009     10/11/12 0915   ceFAZolin (ANCEF) IVPB 1 g/50 mL premix  Status:  Discontinued        1 g 100 mL/hr over 30 Minutes Intravenous 3 times per day 10/11/12 0906 10/11/12 2009          Assessment/Plan: s/p Procedure(s): CHOLECYSTECTOMY Doing well.  FL diet   LOS: 1 day    Joshua Rivas 10/12/2012

## 2012-10-13 MED ORDER — POLYETHYLENE GLYCOL 3350 17 G PO PACK
17.0000 g | PACK | Freq: Two times a day (BID) | ORAL | Status: DC
  Administered 2012-10-13 – 2012-10-15 (×4): 17 g via ORAL
  Filled 2012-10-13 (×9): qty 1

## 2012-10-13 NOTE — Progress Notes (Signed)
Patient ID: Joshua Rivas, male   DOB: Aug 07, 1956, 56 y.o.   MRN: [id number] 2 Days Post-Op  Subjective: Some incisional pain when he gets up and moves around but otherwise comfortable. Tolerating full liquids without nausea but not taking very much. No flatus or bowel movement yet.  Objective: Vital signs in last 24 hours: Temp:  [98 F (36.7 C)-99.8 F (37.7 C)] 98.7 F (37.1 C) (10/20 0529) Pulse Rate:  [67-87] 81  (10/20 0529) Resp:  [16-18] 16  (10/20 0529) BP: (123-133)/(71-88) 123/74 mmHg (10/20 0529) SpO2:  [90 %-96 %] 91 % (10/20 0529) Last BM Date: 10/09/12  Intake/Output from previous day: 10/19 0701 - 10/20 0700 In: 1670 [P.O.:720; I.V.:900; IV Piggyback:50] Out: 1980 [Urine:1950; Drains:30] Intake/Output this shift:    General appearance: alert and no distress GI: nondistended. Mild incisional tenderness. J-P drain with minimal serous sanguinous drainage Incision/Wound: dressing intact and clean and dry  Lab Results:   Basename 10/12/12 0403 10/11/12 0320  WBC 13.4* 13.3*  HGB 13.2 15.2  HCT 41.0 44.3  PLT 260 267   BMET  Basename 10/12/12 0403 10/11/12 0320  NA 137 136  K 4.5 3.8  CL 103 102  CO2 26 23  GLUCOSE 147* 110*  BUN 8 14  CREATININE 0.88 0.97  CALCIUM 8.3* 9.0     Studies/Results: No results found.  Anti-infectives: Anti-infectives     Start     Dose/Rate Route Frequency Ordered Stop   10/11/12 2200   ertapenem (INVANZ) 1 g in sodium chloride 0.9 % 50 mL IVPB        1 g 100 mL/hr over 30 Minutes Intravenous Every 24 hours 10/11/12 2009     10/11/12 0915   ceFAZolin (ANCEF) IVPB 1 g/50 mL premix  Status:  Discontinued        1 g 100 mL/hr over 30 Minutes Intravenous 3 times per day 10/11/12 0906 10/11/12 2009          Assessment/Plan: s/p Procedure(s): CHOLECYSTECTOMY Severe subacute and gangrenous cholecystitis. Doing well. Continue full liquids today. Continue antibiotics. Check labs in a.m.   LOS: 2 days     Edric Fetterman T 10/13/2012

## 2012-10-13 NOTE — Progress Notes (Signed)
Pt would like to take a shower if possible.

## 2012-10-14 ENCOUNTER — Encounter (HOSPITAL_COMMUNITY): Payer: Self-pay | Admitting: General Surgery

## 2012-10-14 LAB — CBC
HCT: 38.7 % — ABNORMAL LOW (ref 39.0–52.0)
MCH: 29.8 pg (ref 26.0–34.0)
MCV: 90.8 fL (ref 78.0–100.0)
Platelets: 279 10*3/uL (ref 150–400)
RBC: 4.26 MIL/uL (ref 4.22–5.81)
WBC: 10.8 10*3/uL — ABNORMAL HIGH (ref 4.0–10.5)

## 2012-10-14 LAB — COMPREHENSIVE METABOLIC PANEL
AST: 19 U/L (ref 0–37)
BUN: 4 mg/dL — ABNORMAL LOW (ref 6–23)
CO2: 27 mEq/L (ref 19–32)
Calcium: 8.3 mg/dL — ABNORMAL LOW (ref 8.4–10.5)
Chloride: 103 mEq/L (ref 96–112)
Creatinine, Ser: 1 mg/dL (ref 0.50–1.35)
GFR calc Af Amer: >90 mL/min (ref >90)
GFR calc non Af Amer: 83 mL/min — ABNORMAL LOW (ref >90)
Total Bilirubin: 0.2 mg/dL — ABNORMAL LOW (ref 0.3–1.2)

## 2012-10-14 MED ORDER — DOCUSATE SODIUM 100 MG PO CAPS
100.0000 mg | ORAL_CAPSULE | Freq: Two times a day (BID) | ORAL | Status: DC
  Administered 2012-10-14 – 2012-10-16 (×5): 100 mg via ORAL
  Filled 2012-10-14 (×6): qty 1

## 2012-10-14 NOTE — Progress Notes (Signed)
Patient ID: Joshua Rivas, male   DOB: [birth year], 56 y.o.   MRN: [id number] 3 Days Post-Op  Subjective: Some incisional pain but no severe pain, tolerating full liquids well, ambulating in hall, denies n/v.  No flatus or BM.  Objective: Vital signs in last 24 hours: Temp:  [98.1 F (36.7 C)-98.9 F (37.2 C)] 98.2 F (36.8 C) (10/21 0528) Pulse Rate:  [71-99] 71  (10/21 0528) Resp:  [16-18] 16  (10/21 0528) BP: (115-130)/(77-85) 128/83 mmHg (10/21 0528) SpO2:  [90 %-93 %] 92 % (10/21 0528) Last BM Date: 10/09/12  Intake/Output from previous day: 10/20 0701 - 10/21 0700 In: 3290 [P.O.:1440; I.V.:1800; IV Piggyback:50] Out: 2225 [Urine:2100; Drains:125] Intake/Output this shift: Total I/O In: 296.3 [I.V.:296.3] Out: -   General appearance: alert, cooperative and no distress GI: mildly tender, +bs, drain with serosang output Incision/Wound: staples in place in umbilicus and flank, incisions c/d/i  Lab Results:   Basename 10/14/12 0355 10/12/12 0403  WBC 10.8* 13.4*  HGB 12.7* 13.2  HCT 38.7* 41.0  PLT 279 260   BMET  Basename 10/14/12 0355 10/12/12 0403  NA 136 137  K 3.8 4.5  CL 103 103  CO2 27 26  GLUCOSE 109* 147*  BUN 4* 8  CREATININE 1.00 0.88  CALCIUM 8.3* 8.3*     Studies/Results: No results found.  Anti-infectives: Anti-infectives     Start     Dose/Rate Route Frequency Ordered Stop   10/11/12 2200   ertapenem (INVANZ) 1 g in sodium chloride 0.9 % 50 mL IVPB        1 g 100 mL/hr over 30 Minutes Intravenous Every 24 hours 10/11/12 2009     10/11/12 0915   ceFAZolin (ANCEF) IVPB 1 g/50 mL premix  Status:  Discontinued        1 g 100 mL/hr over 30 Minutes Intravenous 3 times per day 10/11/12 0906 10/11/12 2009          Assessment/Plan: 1. POD#3- open cholecystectomy: Severe subacute and gangrenous cholecystitis.  Overall doing well.  --WBC trending down, continue invanz  --keep on full liquids today since no flatus or bm but will add  colace  --OOB and ambulate  --leave JP drain ( over 24hrs)   LOS: 3 days    Joshua Rivas 10/14/2012

## 2012-10-14 NOTE — Care Management Note (Signed)
    Page 1 of 1   10/16/2012     11:16:14 AM   CARE MANAGEMENT NOTE 10/16/2012  Patient:  Joshua Rivas, Joshua Rivas.   Account Number:  [id number]  Date Initiated:  10/14/2012  Documentation initiated by:  Haddy Mullinax  Subjective/Objective Assessment:   56 yo male admitted s/p open cholecystectomy. PTA lived at home with spouse.     Action/Plan:   home when stable   Anticipated DC Date:  10/18/2012   Anticipated DC Plan:  HOME/SELF CARE      DC Planning Services  CM consult      Choice offered to / List presented to:             Status of service:  Completed, signed off Medicare Important Message given?   (If response is "NO", the following Medicare IM given date fields will be blank) Date Medicare IM given:   Date Additional Medicare IM given:    Discharge Disposition:  HOME/SELF CARE  Per UR Regulation:  Reviewed for med. necessity/level of care/duration of stay  If discussed at Long Length of Stay Meetings, dates discussed:    Comments:

## 2012-10-15 MED ORDER — METRONIDAZOLE 500 MG PO TABS
500.0000 mg | ORAL_TABLET | Freq: Two times a day (BID) | ORAL | Status: DC
  Administered 2012-10-15 – 2012-10-16 (×3): 500 mg via ORAL
  Filled 2012-10-15 (×4): qty 1

## 2012-10-15 MED ORDER — CIPROFLOXACIN HCL 500 MG PO TABS
500.0000 mg | ORAL_TABLET | Freq: Two times a day (BID) | ORAL | Status: DC
  Administered 2012-10-15 – 2012-10-16 (×2): 500 mg via ORAL
  Filled 2012-10-15 (×4): qty 1

## 2012-10-15 NOTE — Progress Notes (Signed)
Patient ID: Joshua Rivas, male   DOB: 1956-11-13, 56 y.o.   MRN: [id number] 4 Days Post-Op  Subjective: 56 y/o male POD 4 from Open Lap Chole.  Pt feeling very well today and doing well on oral pain meds.  Pt is up ambulating around the floor and is tolerating diet.  Pt denies any BM yet, but is passing gas and denies any nausea.  No chest pain, SOB, or tenderness in legs.    Objective: Vital signs in last 24 hours: Temp:  [98.2 F (36.8 C)-98.6 F (37 C)] 98.2 F (36.8 C) (10/22 0620) Pulse Rate:  [63-72] 63  (10/22 0620) Resp:  [18-20] 18  (10/22 0620) BP: (126-133)/(78-84) 126/84 mmHg (10/22 0620) SpO2:  [95 %-97 %] 95 % (10/22 0620) Last BM Date: 10/09/12  Intake/Output from previous day: 10/21 0701 - 10/22 0700 In: 3373.8 [P.O.:1560; I.V.:1763.8; IV Piggyback:50] Out: 2965 [Urine:2825; Drains:140] Intake/Output this shift:    PE: Abd: Soft, NT/ND, incision site in RUQ is CDI, drain has a small amount of serous drainage  Lab Results:   Basename 10/14/12 0355  WBC 10.8*  HGB 12.7*  HCT 38.7*  PLT 279   BMET  Basename 10/14/12 0355  NA 136  K 3.8  CL 103  CO2 27  GLUCOSE 109*  BUN 4*  CREATININE 1.00  CALCIUM 8.3*   PT/INR No results found for this basename: LABPROT:2,INR:2 in the last 72 hours CMP     Component Value Date/Time   NA 136 10/14/2012 0355   K 3.8 10/14/2012 0355   CL 103 10/14/2012 0355   CO2 27 10/14/2012 0355   GLUCOSE 109* 10/14/2012 0355   BUN 4* 10/14/2012 0355   CREATININE 1.00 10/14/2012 0355   CALCIUM 8.3* 10/14/2012 0355   PROT 5.5* 10/14/2012 0355   ALBUMIN 2.4* 10/14/2012 0355   AST 19 10/14/2012 0355   ALT 22 10/14/2012 0355   ALKPHOS 69 10/14/2012 0355   BILITOT 0.2* 10/14/2012 0355   GFRNONAA 83* 10/14/2012 0355   GFRAA >90 10/14/2012 0355   Lipase     Component Value Date/Time   LIPASE 28 10/11/2012 0320       Studies/Results: No results found.  Anti-infectives: Anti-infectives     Start     Dose/Rate  Route Frequency Ordered Stop   10/11/12 2200   ertapenem (INVANZ) 1 g in sodium chloride 0.9 % 50 mL IVPB        1 g 100 mL/hr over 30 Minutes Intravenous Every 24 hours 10/11/12 2009     10/11/12 0915   ceFAZolin (ANCEF) IVPB 1 g/50 mL premix  Status:  Discontinued        1 g 100 mL/hr over 30 Minutes Intravenous 3 times per day 10/11/12 0906 10/11/12 2009           Assessment/Plan Primary Problem:  Day 4 Post-op Open Lap Chole 1.  Will progress diet to low fat diet 2.  Switch from IV to PO antibiotics 3.  If doing well tomorrow with low fat diet patient can be discharged to home 4.  Continue ambulation    LOS: 4 days    DORT, Nakeia Calvi 10/15/2012, 10:34 AM Pager: [phone]

## 2012-10-16 MED ORDER — CIPROFLOXACIN HCL 500 MG PO TABS: 500.0000 mg | ORAL_TABLET | Freq: Two times a day (BID) | ORAL | Status: DC

## 2012-10-16 MED ORDER — OXYCODONE-ACETAMINOPHEN 5-325 MG PO TABS: 1.0000 | ORAL_TABLET | Freq: Four times a day (QID) | ORAL | Status: DC | PRN

## 2012-10-16 MED ORDER — METRONIDAZOLE 500 MG PO TABS: 500.0000 mg | ORAL_TABLET | Freq: Two times a day (BID) | ORAL | Status: DC

## 2012-10-16 NOTE — Progress Notes (Signed)
Pt has been very aggressive with nurse this shift about pain medications. The first pain was given at 851 pm, Pt requested 2 tabs of po percocet for a pain score of 4/10. He was very angry and aggressive towards the nurse about receiving 1 tab instead of two. Upon making 3 am round, the pt did not request any PRN from the nurse and called up to the front desk stating "weren't you suppose to bring me some pain medicine?". Nurse responded with PRN's need to be requested, Pt stated "Oh please! Just bring me the damnb medicine NOW!". Nurse Informed the CN of the situation and both nurse and CN entered Pt's room. CN attempted to explain to Pt that PRN are medications the patient needs to request and upon request it would be brought to him. The patient stated "this is new information, I have never had to ask for it and the other Nurse brought it to me every 4 hours. This is bullshit".  Pt then requested to have another nurse for the remainder of the shift and was granted so.

## 2012-10-16 NOTE — Progress Notes (Signed)
Received patient. Lurena Joiner, RN

## 2012-10-16 NOTE — Progress Notes (Signed)
Patient is alert and oriented, vital signs are stable, incision within normal limits, dressing changed to right lower JP drain, pain controlled with percocet, patient going home with JP drain and educated on how to do drain care. Tolerating diet without nausea or vomiting, wife at bedside and supportive, patient to follow up with MD for drain removal in office. Means, Myrtie Hawk RN 10-16-2012 10:09am

## 2012-10-16 NOTE — Discharge Summary (Signed)
  Physician Discharge Summary  Patient ID: Joshua Rivas MRN: [id number] DOB/AGE: 1956-02-16 56 y.o.  Admit date: 10/11/2012 Discharge date: 10/16/2012  Admitting Diagnosis: Abdominal Pain  Discharge Diagnosis Patient Active Problem List   Diagnosis Date Noted  . Cholecystitis with cholelithiasis 10/11/2012  . Groin cyst 04/08/2012    Consultants None  Procedures Attempted Laparoscopic Cholecystectomy converted to an Open Cholecystectomy with surgical drain placement  Hospital Course:  56 y/o male who presented to [facility] with abdominal pain.  Workup showed Cholelithiasis and cholecystitis.  Patient was admitted and underwent procedure listed above.  Tolerated procedure well and was transferred to the floor.  Diet was advanced as tolerated.  On POD 5, the patient was voiding well, tolerating low fat diet, ambulating well, pain well controlled, vital signs stable, incisions c/d/i and felt stable for discharge home.  Pt continues to drain 95mL from JP drain yesterday (10/21) and on 10/21.  Patient will follow up in our office early next week for staple and drain removal, and knows to call with questions or concerns.    Medication List     As of 10/16/2012  8:46 AM    TAKE these medications         ciprofloxacin 500 MG tablet   Commonly known as: CIPRO   Take 1 tablet (500 mg total) by mouth 2 (two) times daily.      ibuprofen 200 MG tablet   Commonly known as: ADVIL,MOTRIN   Take 400 mg by mouth every 6 (six) hours as needed. For pain      metroNIDAZOLE 500 MG tablet   Commonly known as: FLAGYL   Take 1 tablet (500 mg total) by mouth every 12 (twelve) hours.      OVER THE COUNTER MEDICATION   Take 1 Bottle by mouth once. Otc. [facility] brand gentle laxative.      oxyCODONE-acetaminophen 5-325 MG per tablet   Commonly known as: PERCOCET/ROXICET   Take 1-2 tablets by mouth every 6 (six) hours as needed for pain.      pantoprazole 20 MG tablet   Commonly known as:  PROTONIX   Take 1 tablet (20 mg total) by mouth daily.      sucralfate 1 GM/10ML suspension   Commonly known as: CARAFATE   Take 6 g by mouth 3 (three) times daily as needed. For abdominal pain             Follow-up Information    Follow up with Mariella Saa, MD. In 5 days. (Our office will contact you with your appointment time and date.)    Contact information:   [address] [place] [address] [phone]          Signed: Candiss Norse [facility] [phone]  10/16/2012, 8:46 AM

## 2012-10-21 ENCOUNTER — Encounter (INDEPENDENT_AMBULATORY_CARE_PROVIDER_SITE_OTHER): Payer: Self-pay

## 2012-10-21 ENCOUNTER — Ambulatory Visit (INDEPENDENT_AMBULATORY_CARE_PROVIDER_SITE_OTHER): Payer: BC Managed Care – PPO

## 2012-10-21 DIAGNOSIS — Z9889 Other specified postprocedural states: Secondary | ICD-10-CM

## 2012-10-21 NOTE — Progress Notes (Signed)
Procedure:  Laparoscopic converted to open cholecystectomy  Date:  10/11/2012  Pathology:  Acute gangrenous cholecystitis  History:  He is here for his first postoperative visit. He still has a drain in. He is tolerating his diet and his bowels are moving.  Exam: General- Is in NAD.  Abdomen-soft, right subcostal incision and the subumbilical incision are clean and intact with staples in. There is a drain in which has serous output and I removed this and applied a dry dressing.  Assessment:  Doing well postoperatively. He has some questions of some of his medications and I told him he could discontinue most of the medications he is on.  Plan:  Remove staples from the wounds. 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 Continue light activities. Return visit with Dr. Johna Sheriff in 2-3 weeks.

## 2012-10-30 ENCOUNTER — Encounter (INDEPENDENT_AMBULATORY_CARE_PROVIDER_SITE_OTHER): Payer: BC Managed Care – PPO | Admitting: Surgery

## 2012-10-30 ENCOUNTER — Telehealth (INDEPENDENT_AMBULATORY_CARE_PROVIDER_SITE_OTHER): Payer: Self-pay | Admitting: General Surgery

## 2012-10-30 NOTE — Telephone Encounter (Signed)
LMOM letting pt know that according to Dr. Abbey Chatters he does not need to be seen for 2-3 weeks from his last appt on 10/28.  I informed him I rescheduled his appt for today to 11/22 w/ Dr. Johna Sheriff.

## 2012-11-01 ENCOUNTER — Ambulatory Visit (INDEPENDENT_AMBULATORY_CARE_PROVIDER_SITE_OTHER): Payer: BC Managed Care – PPO | Admitting: General Surgery

## 2012-11-01 ENCOUNTER — Encounter (INDEPENDENT_AMBULATORY_CARE_PROVIDER_SITE_OTHER): Payer: Self-pay | Admitting: General Surgery

## 2012-11-01 VITALS — BP 122/82 | HR 71 | Temp 97.0°F | Resp 16 | Ht 69.0 in | Wt 214.6 lb

## 2012-11-01 DIAGNOSIS — Z09 Encounter for follow-up examination after completed treatment for conditions other than malignant neoplasm: Secondary | ICD-10-CM

## 2012-11-01 NOTE — Patient Instructions (Addendum)
No heavy lifting or strenuous activity for the next 3 weeks and then may gradually resume any activity. Diet as tolerated without restrictions.

## 2012-11-01 NOTE — Progress Notes (Signed)
History: Patient returns to the office 3 weeks following open cholecystectomy for severe gangrenous cholecystitis. His drain and staples were removed about 10 days ago. He is making steady progress. He still has some expected soreness around his incision but is back at work and tolerating regular diet and has no specific complaints.  Exam: BP 122/82  Pulse 71  Temp 97 F (36.1 C) (Temporal)  Resp 16  Ht 5\' 9"  (1.753 m)  Wt 214 lb 9.6 oz (97.342 kg)  BMI 31.69 kg/m2 General: Appears well Abdomen: Soft and nontender. Incisions are well healed without complication.  Assessment and plan: Doing well following open cholecystectomy without apparent complication. We reviewed activity limitations and diet. He is to return as needed.

## 2012-11-15 ENCOUNTER — Encounter (INDEPENDENT_AMBULATORY_CARE_PROVIDER_SITE_OTHER): Payer: BC Managed Care – PPO | Admitting: General Surgery

## 2012-11-28 ENCOUNTER — Telehealth (INDEPENDENT_AMBULATORY_CARE_PROVIDER_SITE_OTHER): Payer: Self-pay

## 2012-11-28 NOTE — Telephone Encounter (Signed)
The patient called s/p open Cholecystectomy 10/11/12.  He said over the past few days he has experienced a sensation near his incision like he is being poked with a pin.  It is more sensitive to touch than it was.  He has been sneezing more lately so he wasn't sure if something has happened.  He has no fever, no redness, no bulge, no drainage.  He is eating ok and moving his bowels.  I thought maybe it was still healing and some nerve endings that he could feel that are trying to heal.  I ran it by Dr Ezzard Standing and he said if it's bothering him he maybe should see Dr Johna Sheriff.  The pt states it is and would like a call back with an appointment.  I told him he may have to be worked in, Im not sure but someone will call him back when Dr Johna Sheriff is here tomorrow.

## 2013-09-05 ENCOUNTER — Emergency Department (HOSPITAL_COMMUNITY)
Admission: EM | Admit: 2013-09-05 | Discharge: 2013-09-06 | Disposition: A | Discharge status: Home / Self Care | Payer: BC Managed Care – PPO | Attending: Emergency Medicine | Admitting: Emergency Medicine

## 2013-09-05 ENCOUNTER — Encounter (HOSPITAL_COMMUNITY): Payer: Self-pay

## 2013-09-05 DIAGNOSIS — R209 Unspecified disturbances of skin sensation: Secondary | ICD-10-CM | POA: Insufficient documentation

## 2013-09-05 DIAGNOSIS — Z9089 Acquired absence of other organs: Secondary | ICD-10-CM | POA: Insufficient documentation

## 2013-09-05 DIAGNOSIS — R1031 Right lower quadrant pain: Secondary | ICD-10-CM | POA: Insufficient documentation

## 2013-09-05 DIAGNOSIS — Z872 Personal history of diseases of the skin and subcutaneous tissue: Secondary | ICD-10-CM | POA: Insufficient documentation

## 2013-09-05 DIAGNOSIS — R1011 Right upper quadrant pain: Secondary | ICD-10-CM | POA: Insufficient documentation

## 2013-09-05 DIAGNOSIS — R109 Unspecified abdominal pain: Secondary | ICD-10-CM

## 2013-09-05 LAB — CBC WITH DIFFERENTIAL/PLATELET
HCT: 49.6 % (ref 39.0–52.0)
Hemoglobin: 16.6 g/dL (ref 13.0–17.0)
Lymphocytes Relative: 24 % (ref 12–46)
MCHC: 33.5 g/dL (ref 30.0–36.0)
Monocytes Absolute: 1.1 10*3/uL — ABNORMAL HIGH (ref 0.1–1.0)
Monocytes Relative: 8 % (ref 3–12)
Neutro Abs: 8.9 10*3/uL — ABNORMAL HIGH (ref 1.7–7.7)
WBC: 13.2 10*3/uL — ABNORMAL HIGH (ref 4.0–10.5)

## 2013-09-05 LAB — URINALYSIS, ROUTINE W REFLEX MICROSCOPIC
Bilirubin Urine: NEGATIVE
Glucose, UA: NEGATIVE mg/dL
Ketones, ur: NEGATIVE mg/dL
pH: 5.5 (ref 5.0–8.0)

## 2013-09-05 NOTE — ED Notes (Signed)
Per pt, gallbladder taken out last October.  Pt states abdominal pain to lower rt quadrant.  No fever.  No n/v.  Change in bowel pattern since surgery but no recent change.

## 2013-09-06 ENCOUNTER — Emergency Department (HOSPITAL_COMMUNITY): Payer: BC Managed Care – PPO

## 2013-09-06 LAB — BASIC METABOLIC PANEL
BUN: 14 mg/dL (ref 6–23)
CO2: 29 mEq/L (ref 19–32)
Chloride: 101 mEq/L (ref 96–112)
Creatinine, Ser: 1 mg/dL (ref 0.50–1.35)

## 2013-09-06 LAB — HEPATIC FUNCTION PANEL
AST: 23 U/L (ref 0–37)
Albumin: 3.8 g/dL (ref 3.5–5.2)
Alkaline Phosphatase: 115 U/L (ref 39–117)
Total Bilirubin: 0.2 mg/dL — ABNORMAL LOW (ref 0.3–1.2)

## 2013-09-06 MED ORDER — IOHEXOL 300 MG/ML  SOLN
100.0000 mL | Freq: Once | INTRAMUSCULAR | Status: AC | PRN
  Administered 2013-09-06: 100 mL via INTRAVENOUS

## 2013-09-06 MED ORDER — IOHEXOL 300 MG/ML  SOLN
50.0000 mL | Freq: Once | INTRAMUSCULAR | Status: AC | PRN
  Administered 2013-09-06: 50 mL via ORAL

## 2013-09-06 NOTE — ED Notes (Signed)
Pt awaiting Ct scan  

## 2013-09-06 NOTE — ED Notes (Signed)
Pt denies nausea or diarrhea,  States he had surgery about a year ago and has continue to notice that he had tenderness just below incision of gallbladder removal.  Pt is alert and oriented in NAD

## 2013-09-06 NOTE — ED Provider Notes (Signed)
CSN: [id number]     Arrival date & time 09/05/13  2215 History   First MD Initiated Contact with Patient 09/06/13 0031     Chief Complaint  Patient presents with  . Abdominal Pain   (Consider location/radiation/quality/duration/timing/severity/associated sxs/prior Treatment) HPI This is a 57 year old male who had his gallbladder taken out last year. Patient has large scar in his right upper quadrant from prior surgery. States he's had numbness in the area of the scar and pain since his surgery. Patient is concerned he is having worsening pain at the site but is unclear as to when it became so. He denies nausea vomiting diarrhea or constipation. He's had no fevers or chills. She has had no skin changes to the abdomen. Past Medical History  Diagnosis Date  . Cyst     Left groin  . Groin cyst 03/2012    left  . Fecal impaction 2010   Past Surgical History  Procedure Laterality Date  . Cholecystectomy  10/11/2012    Procedure: CHOLECYSTECTOMY;  Surgeon: Mariella Saa, MD;  Location: [facility];  Service: General;  Laterality: N/A;  laparoscopic to open cholecystectomy   History reviewed. No pertinent family history. History  Substance Use Topics  . Smoking status: Never Smoker   . Smokeless tobacco: Never Used  . Alcohol Use: Yes    Review of Systems  Constitutional: Negative for fever and chills.  Respiratory: Negative for cough, shortness of breath and wheezing.   Cardiovascular: Negative for chest pain and palpitations.  Gastrointestinal: Positive for abdominal pain. Negative for nausea, vomiting, diarrhea and constipation.  Musculoskeletal: Negative for myalgias and back pain.  Skin: Negative for rash and wound.  Neurological: Positive for numbness. Negative for dizziness, weakness, light-headedness and headaches.  All other systems reviewed and are negative.    Allergies  Review of patient's allergies indicates no known allergies.  Home Medications   Current  Outpatient Rx  Name  Route  Sig  Dispense  Refill  . acetaminophen (TYLENOL) 500 MG tablet   Oral   Take 500 mg by mouth every 6 (six) hours as needed for pain.         Marland Kitchen psyllium (HYDROCIL/METAMUCIL) 95 % PACK   Oral   Take 1 packet by mouth every morning.          BP 139/78  Pulse 84  Temp(Src) 98.5 F (36.9 C) (Oral)  Resp 16  SpO2 97% Physical Exam  Nursing note and vitals reviewed. Constitutional: He is oriented to person, place, and time. He appears well-developed and well-nourished. No distress.  HENT:  Head: Normocephalic and atraumatic.  Mouth/Throat: Oropharynx is clear and moist.  Eyes: EOM are normal. Pupils are equal, round, and reactive to light.  Neck: Normal range of motion. Neck supple.  Cardiovascular: Normal rate and regular rhythm.   Pulmonary/Chest: Effort normal and breath sounds normal. No respiratory distress. He has no wheezes. He has no rales.  Abdominal: Soft. Bowel sounds are normal. He exhibits no distension and no mass. There is tenderness. There is no rebound and no guarding.  Patient has roughly 15 cm scar to right upper quadrant. He has mild tenderness to palpation inferior to the scar the bottom of the right upper quadrant and the top of the right lower quadrant. No rebound or guarding. No masses appreciated. No obvious skin changes.  Musculoskeletal: Normal range of motion. He exhibits no edema and no tenderness.  Neurological: He is alert and oriented to person, place, and time.  No focal motor deficits. Patient has numbness of the skin in the area of his right upper quadrant scar  Skin: Skin is warm and dry. No rash noted. No erythema.  Psychiatric: He has a normal mood and affect. His behavior is normal.    ED Course  Procedures (including critical care time) Labs Review Labs Reviewed  CBC WITH DIFFERENTIAL - Abnormal; Notable for the following:    WBC 13.2 (*)    Neutro Abs 8.9 (*)    Monocytes Absolute 1.1 (*)    All other  components within normal limits  BASIC METABOLIC PANEL - Abnormal; Notable for the following:    Glucose, Bld 103 (*)    GFR calc non Af Amer 83 (*)    All other components within normal limits  URINALYSIS, ROUTINE W REFLEX MICROSCOPIC  LIPASE, BLOOD  HEPATIC FUNCTION PANEL   Imaging Review No results found.  MDM  Patient has a very benign abdominal exam. He does have mild elevation in white blood cells region is appear to be significantly changed from prior laboratory exams. Patient is quite anxious and states he's been reading of the possible causes for abdominal pain in this area. Admits he's been under a lot of stress lately.  Patient is pain free. Signs remained stable in the emergency department. CT without any acute findings. Patient has been given thorough discharge instructions advised to return for fever, increased abdominal pain, persistent vomiting, or any concerns. Patient voiced understanding.  Loren Racer, MD 09/06/13 [phone]

## 2014-08-19 ENCOUNTER — Other Ambulatory Visit: Payer: Self-pay | Admitting: Internal Medicine

## 2014-08-19 DIAGNOSIS — R1010 Upper abdominal pain, unspecified: Secondary | ICD-10-CM

## 2014-08-25 ENCOUNTER — Ambulatory Visit
Admission: RE | Admit: 2014-08-25 | Discharge: 2014-08-25 | Disposition: A | Discharge status: Home / Self Care | Payer: BC Managed Care – PPO | Source: Ambulatory Visit | Attending: Internal Medicine | Admitting: Internal Medicine

## 2014-08-25 DIAGNOSIS — R1010 Upper abdominal pain, unspecified: Secondary | ICD-10-CM

## 2014-08-31 IMAGING — CT CT ABD-PELV W/ CM
1 of 3 series · 14 of 32 positions shown, 19 images · IV contrast (OMNIPAQUE 300)
Comparison: Prior CT from 10/06/2012.

CLINICAL DATA: CT ABDOMEN AND PELVIS WITH CONTRAST
TECHNIQUE: Multidetector CT imaging of the abdomen and pelvis was
performed following the standard protocol during bolus
administration of intravenous contrast.

Contrast: 50mL OMNIPAQUE IOHEXOL 300 MG/ML  SOLN, 100mL OMNIPAQUE
IOHEXOL 300 MG/ML  SOLN

[Series 2: abd/pel with · axial · 0.80mm/px · z∈[+1305,+1730]mm · 14 of 96 slices shown, 19 images]
[im 6/96  soft-tissue]
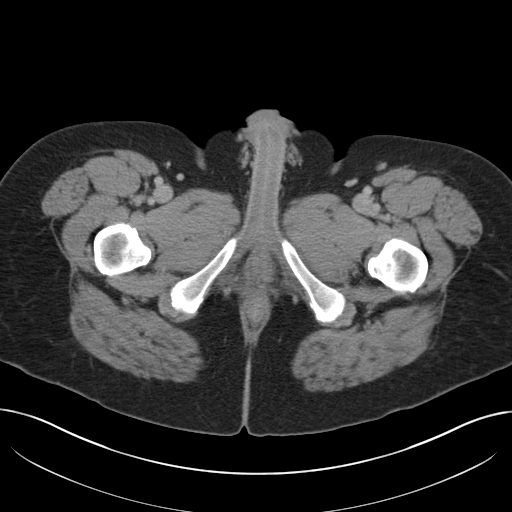
[im 6/96  bone]
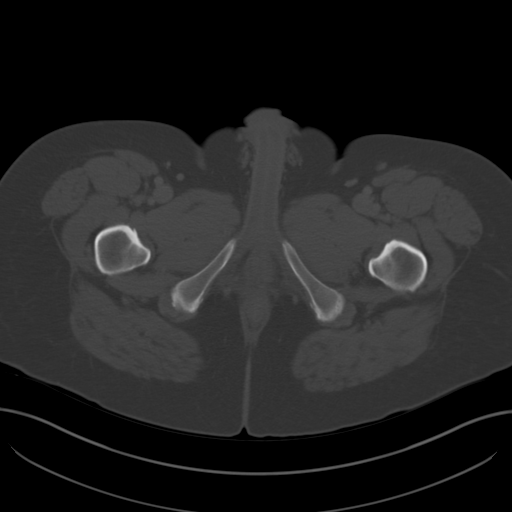
[im 16/96  soft-tissue]
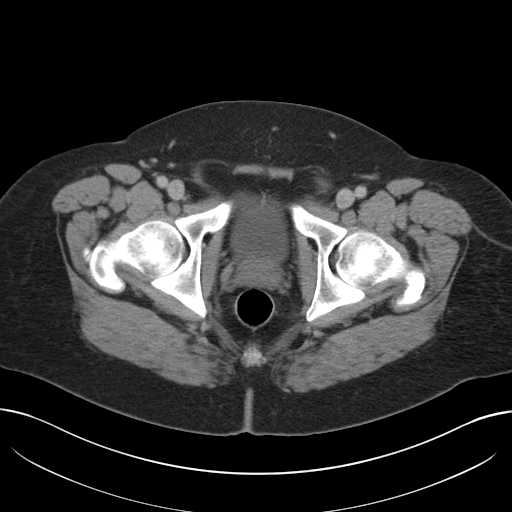
[im 21/96  soft-tissue]
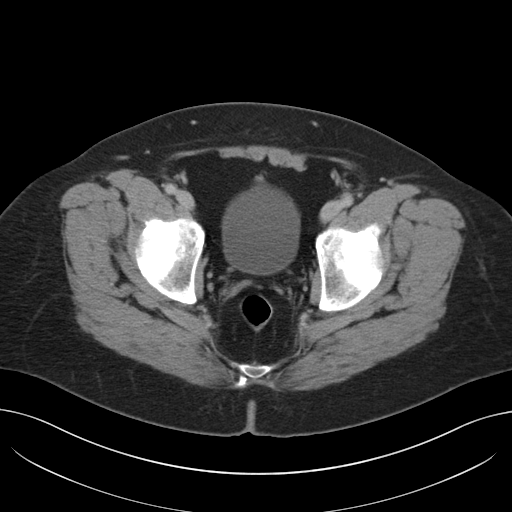
[im 26/96  soft-tissue]
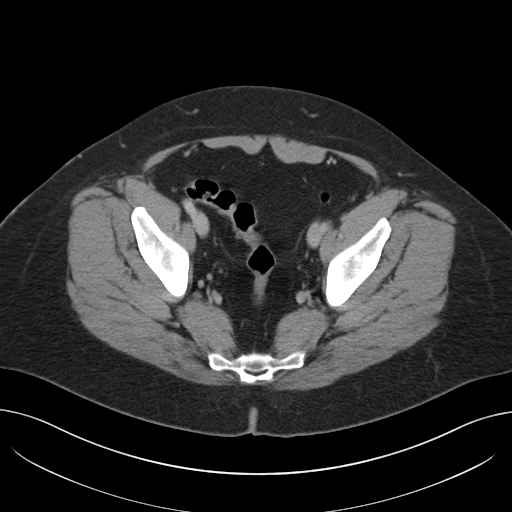
[im 36/96  soft-tissue]
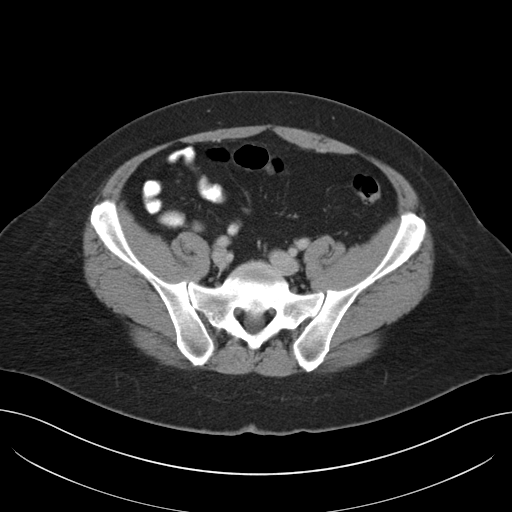
[im 41/96  soft-tissue]
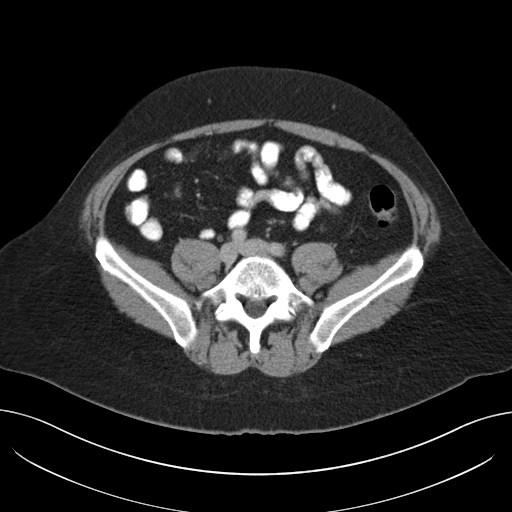
[im 51/96  soft-tissue]
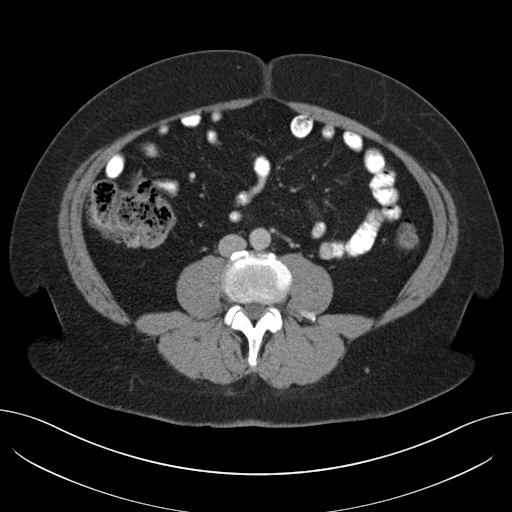
[im 56/96  soft-tissue]
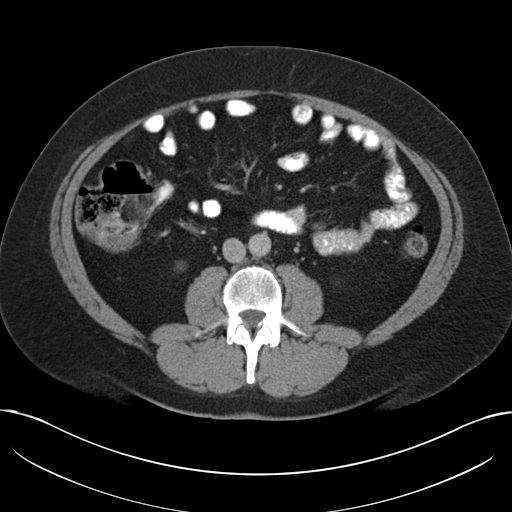
[im 61/96  soft-tissue]
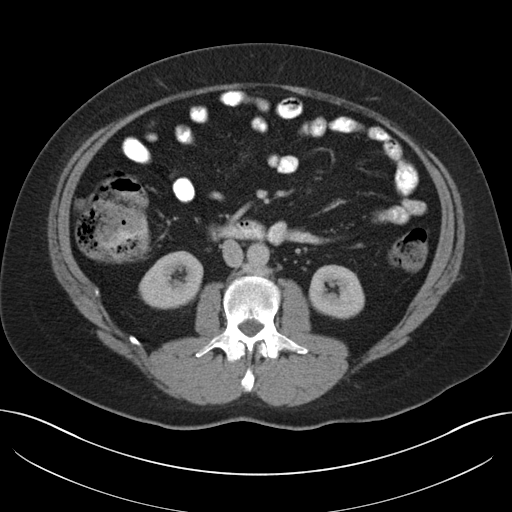
[im 61/96  bone]
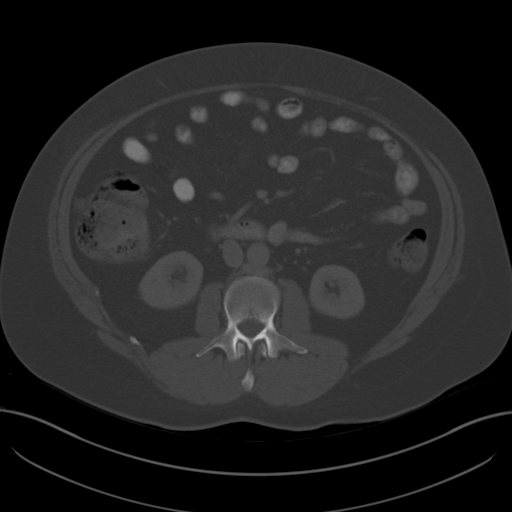
[im 71/96  soft-tissue]
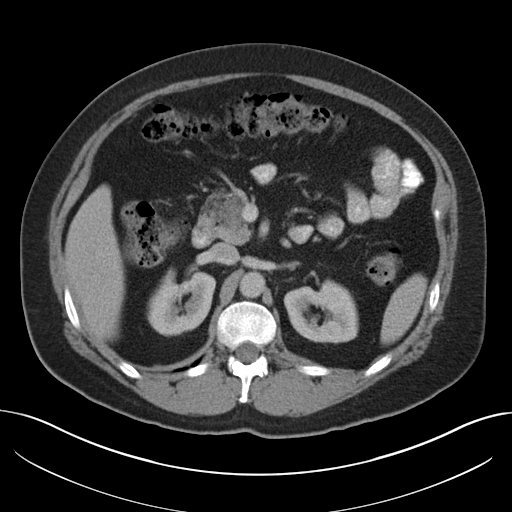
[im 76/96  soft-tissue]
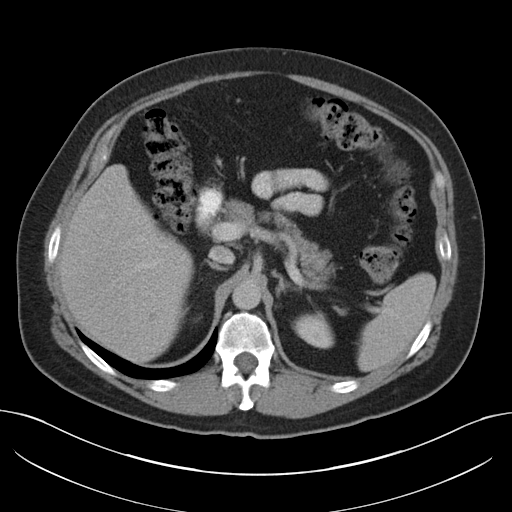
[im 76/96  lung]
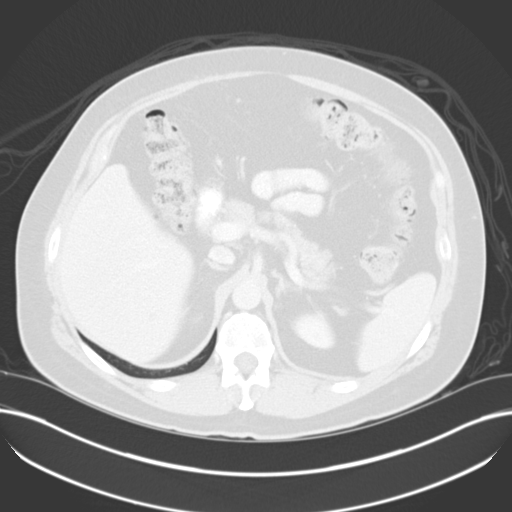
[im 81/96  soft-tissue]
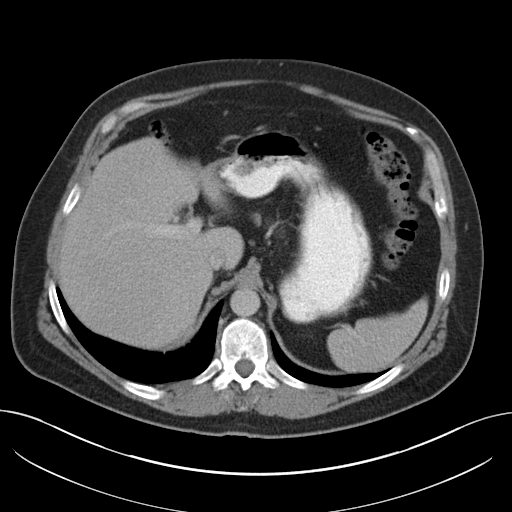
[im 81/96  lung]
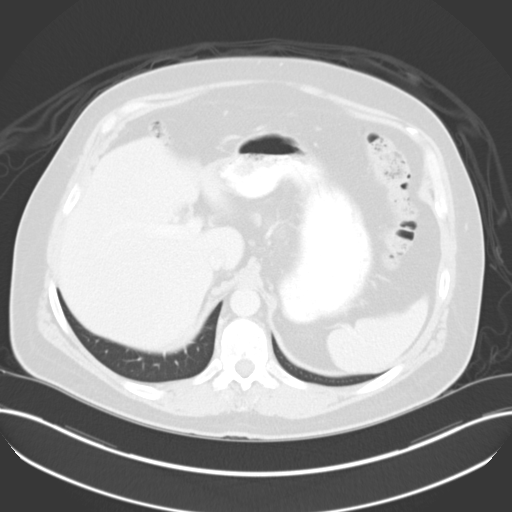
[im 86/96  lung]
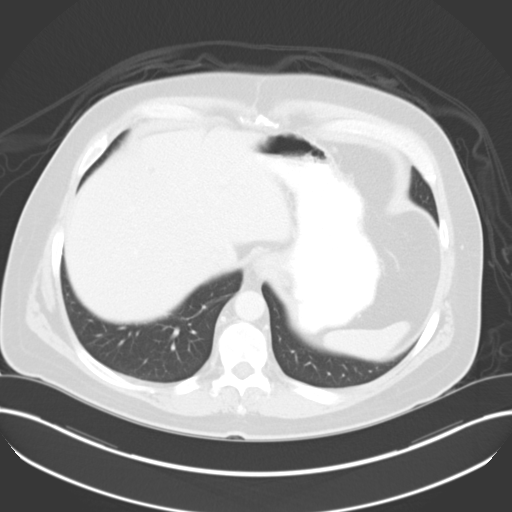
[im 91/96  soft-tissue]
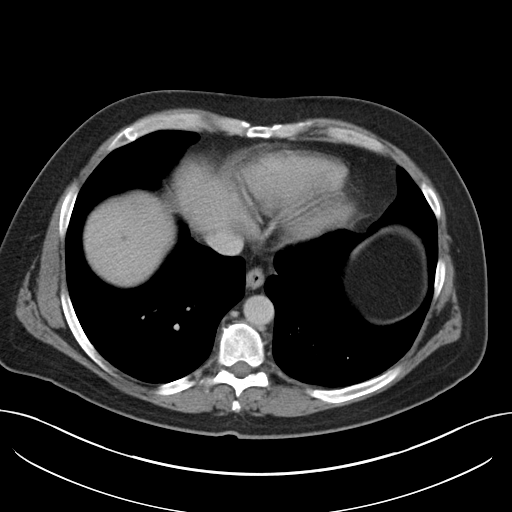
[im 91/96  lung]
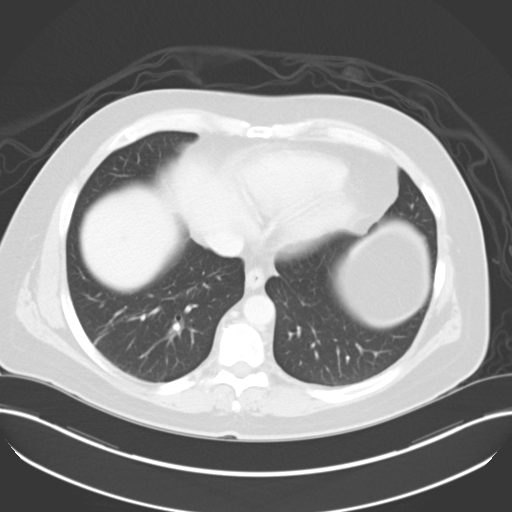

[14 of 32 positions shown; findings below may reference images not displayed]

FINDINGS: Linear opacity within the right lower lobe is most
consistent with atelectasis.  The visualized lung bases are
otherwise clear.

Subcentimeter hypodensities near the dome of the liver within both
the right and left hepatic lobes are unchanged (series 2, image 7).
These are too small to characterize by CT, but likely represent
small cysts. The liver is otherwise unremarkable.  The gallbladder
surgically absent.

The spleen, adrenal glands, and pancreas demonstrate normal
contrast enhanced appearance.

The kidneys are of symmetric size without evidence of
hydronephrosis or nephrolithiasis.  No focal renal mass.

There is no evidence of bowel obstruction.  Appendix is well
visualized right lower quadrant and is of normal caliber and
appearance without associate inflammatory changes to suggest acute
appendicitis.  No abnormal wall thickening enhancement is seen
about the bowels to suggest underlying inflammation.

Bladder and prostate are normal.

No free air or fluid is identified.  No pathologically enlarged
intra-abdominal pelvic lymph nodes are seen.

No acute osseous abnormalities are identified.
IMPRESSION: No CT evidence of acute intra-abdominal pelvic process.  Normal
appendix.  No findings to explain the patient's right upper
quadrant/right lower quadrant abdominal pain.

## 2016-12-27 ENCOUNTER — Encounter (HOSPITAL_BASED_OUTPATIENT_CLINIC_OR_DEPARTMENT_OTHER): Payer: Self-pay | Admitting: *Deleted

## 2017-01-01 ENCOUNTER — Other Ambulatory Visit: Payer: Self-pay | Admitting: Orthopedic Surgery

## 2017-01-02 ENCOUNTER — Encounter (HOSPITAL_BASED_OUTPATIENT_CLINIC_OR_DEPARTMENT_OTHER): Payer: Self-pay | Admitting: Certified Registered"

## 2017-01-02 ENCOUNTER — Ambulatory Visit (HOSPITAL_BASED_OUTPATIENT_CLINIC_OR_DEPARTMENT_OTHER): Payer: BC Managed Care – PPO | Admitting: Anesthesiology

## 2017-01-02 ENCOUNTER — Ambulatory Visit (HOSPITAL_BASED_OUTPATIENT_CLINIC_OR_DEPARTMENT_OTHER)
Admission: RE | Admit: 2017-01-02 | Discharge: 2017-01-02 | Disposition: A | Discharge status: Home / Self Care | Payer: BC Managed Care – PPO | Source: Ambulatory Visit | Attending: Orthopedic Surgery | Admitting: Orthopedic Surgery

## 2017-01-02 ENCOUNTER — Encounter (HOSPITAL_BASED_OUTPATIENT_CLINIC_OR_DEPARTMENT_OTHER)
Admission: RE | Disposition: A | Discharge status: Home / Self Care | Payer: Self-pay | Source: Ambulatory Visit | Attending: Orthopedic Surgery

## 2017-01-02 DIAGNOSIS — M19042 Primary osteoarthritis, left hand: Secondary | ICD-10-CM | POA: Insufficient documentation

## 2017-01-02 DIAGNOSIS — Z6834 Body mass index (BMI) 34.0-34.9, adult: Secondary | ICD-10-CM | POA: Insufficient documentation

## 2017-01-02 DIAGNOSIS — E669 Obesity, unspecified: Secondary | ICD-10-CM | POA: Diagnosis not present

## 2017-01-02 DIAGNOSIS — M25842 Other specified joint disorders, left hand: Secondary | ICD-10-CM | POA: Diagnosis present

## 2017-01-02 DIAGNOSIS — M67442 Ganglion, left hand: Secondary | ICD-10-CM | POA: Diagnosis not present

## 2017-01-02 DIAGNOSIS — Z79899 Other long term (current) drug therapy: Secondary | ICD-10-CM | POA: Insufficient documentation

## 2017-01-02 HISTORY — PX: MASS EXCISION: SHX2000

## 2017-01-02 SURGERY — EXCISION MASS
Anesthesia: General | Site: Finger | Laterality: Left

## 2017-01-02 MED ORDER — DEXAMETHASONE SODIUM PHOSPHATE 10 MG/ML IJ SOLN
INTRAMUSCULAR | Status: DC | PRN
  Administered 2017-01-02: 10 mg via INTRAVENOUS

## 2017-01-02 MED ORDER — HYDROCODONE-ACETAMINOPHEN 5-325 MG PO TABS: ORAL_TABLET | ORAL | 0 refills | Status: DC

## 2017-01-02 MED ORDER — ONDANSETRON HCL 4 MG/2ML IJ SOLN
INTRAMUSCULAR | Status: DC | PRN
  Administered 2017-01-02: 4 mg via INTRAVENOUS

## 2017-01-02 MED ORDER — PROMETHAZINE HCL 25 MG/ML IJ SOLN: 6.2500 mg | INTRAMUSCULAR | Status: DC | PRN

## 2017-01-02 MED ORDER — FENTANYL CITRATE (PF) 100 MCG/2ML IJ SOLN
INTRAMUSCULAR | Status: AC
  Filled 2017-01-02: qty 2

## 2017-01-02 MED ORDER — MIDAZOLAM HCL 2 MG/2ML IJ SOLN
INTRAMUSCULAR | Status: AC
Start: 2017-01-02 — End: 2017-01-02
  Filled 2017-01-02: qty 2

## 2017-01-02 MED ORDER — OXYCODONE HCL 5 MG/5ML PO SOLN
5.0000 mg | Freq: Once | ORAL | Status: DC | PRN
Start: 2017-01-02 — End: 2017-01-02

## 2017-01-02 MED ORDER — BUPIVACAINE HCL (PF) 0.25 % IJ SOLN
INTRAMUSCULAR | Status: DC | PRN
  Administered 2017-01-02: 10 mL

## 2017-01-02 MED ORDER — OXYCODONE HCL 5 MG PO TABS: 5.0000 mg | ORAL_TABLET | Freq: Once | ORAL | Status: DC | PRN

## 2017-01-02 MED ORDER — HYDROMORPHONE HCL 1 MG/ML IJ SOLN: 0.2500 mg | INTRAMUSCULAR | Status: DC | PRN

## 2017-01-02 MED ORDER — LACTATED RINGERS IV SOLN
INTRAVENOUS | Status: DC
  Administered 2017-01-02 (×2): via INTRAVENOUS

## 2017-01-02 MED ORDER — MIDAZOLAM HCL 2 MG/2ML IJ SOLN
1.0000 mg | INTRAMUSCULAR | Status: DC | PRN
  Administered 2017-01-02: 1 mg via INTRAVENOUS

## 2017-01-02 MED ORDER — LIDOCAINE 2% (20 MG/ML) 5 ML SYRINGE
INTRAMUSCULAR | Status: DC | PRN
  Administered 2017-01-02: 100 mg via INTRAVENOUS

## 2017-01-02 MED ORDER — MEPERIDINE HCL 25 MG/ML IJ SOLN: 6.2500 mg | INTRAMUSCULAR | Status: DC | PRN

## 2017-01-02 MED ORDER — CEFAZOLIN SODIUM-DEXTROSE 2-4 GM/100ML-% IV SOLN
2.0000 g | INTRAVENOUS | Status: AC
  Administered 2017-01-02: 2 g via INTRAVENOUS

## 2017-01-02 MED ORDER — CHLORHEXIDINE GLUCONATE 4 % EX LIQD
60.0000 mL | Freq: Once | CUTANEOUS | Status: AC
  Administered 2017-01-02: 4 via TOPICAL

## 2017-01-02 MED ORDER — SCOPOLAMINE 1 MG/3DAYS TD PT72: 1.0000 | MEDICATED_PATCH | Freq: Once | TRANSDERMAL | Status: DC | PRN

## 2017-01-02 MED ORDER — PROPOFOL 10 MG/ML IV BOLUS
INTRAVENOUS | Status: DC | PRN
  Administered 2017-01-02: 200 mg via INTRAVENOUS

## 2017-01-02 MED ORDER — LIDOCAINE HCL (CARDIAC) 20 MG/ML IV SOLN: INTRAVENOUS | Status: DC | PRN

## 2017-01-02 MED ORDER — FENTANYL CITRATE (PF) 100 MCG/2ML IJ SOLN
50.0000 ug | INTRAMUSCULAR | Status: DC | PRN
  Administered 2017-01-02 (×2): 50 ug via INTRAVENOUS

## 2017-01-02 MED ORDER — CEFAZOLIN SODIUM-DEXTROSE 2-4 GM/100ML-% IV SOLN
INTRAVENOUS | Status: AC
  Filled 2017-01-02: qty 100

## 2017-01-02 SURGICAL SUPPLY — 56 items
BANDAGE ACE 3X5.8 VEL STRL LF (GAUZE/BANDAGES/DRESSINGS) IMPLANT
BANDAGE COBAN STERILE 2 (GAUZE/BANDAGES/DRESSINGS) IMPLANT
BENZOIN TINCTURE PRP APPL 2/3 (GAUZE/BANDAGES/DRESSINGS) IMPLANT
BLADE MINI RND TIP GREEN BEAV (BLADE) IMPLANT
BLADE SURG 15 STRL LF DISP TIS (BLADE) ×4 IMPLANT
BLADE SURG 15 STRL SS (BLADE) ×4
BNDG COHESIVE 1X5 TAN STRL LF (GAUZE/BANDAGES/DRESSINGS) ×4 IMPLANT
BNDG CONFORM 2 STRL LF (GAUZE/BANDAGES/DRESSINGS) IMPLANT
BNDG ELASTIC 2X5.8 VLCR STR LF (GAUZE/BANDAGES/DRESSINGS) IMPLANT
BNDG ESMARK 4X9 LF (GAUZE/BANDAGES/DRESSINGS) IMPLANT
BNDG GAUZE 1X2.1 STRL (MISCELLANEOUS) IMPLANT
BNDG GAUZE ELAST 4 BULKY (GAUZE/BANDAGES/DRESSINGS) IMPLANT
BNDG PLASTER X FAST 3X3 WHT LF (CAST SUPPLIES) IMPLANT
CHLORAPREP W/TINT 26ML (MISCELLANEOUS) ×4 IMPLANT
CLOSURE WOUND 1/2 X4 (GAUZE/BANDAGES/DRESSINGS)
CORDS BIPOLAR (ELECTRODE) ×4 IMPLANT
COVER BACK TABLE 60X90IN (DRAPES) ×4 IMPLANT
COVER MAYO STAND STRL (DRAPES) ×4 IMPLANT
CUFF TOURNIQUET SINGLE 18IN (TOURNIQUET CUFF) ×4 IMPLANT
DRAPE EXTREMITY T 121X128X90 (DRAPE) ×4 IMPLANT
DRAPE SURG 17X23 STRL (DRAPES) ×4 IMPLANT
GAUZE SPONGE 4X4 12PLY STRL (GAUZE/BANDAGES/DRESSINGS) ×4 IMPLANT
GAUZE XEROFORM 1X8 LF (GAUZE/BANDAGES/DRESSINGS) ×4 IMPLANT
GLOVE BIO SURGEON STRL SZ7.5 (GLOVE) ×4 IMPLANT
GLOVE BIOGEL PI IND STRL 7.0 (GLOVE) ×2 IMPLANT
GLOVE BIOGEL PI IND STRL 7.5 (GLOVE) ×2 IMPLANT
GLOVE BIOGEL PI IND STRL 8 (GLOVE) ×2 IMPLANT
GLOVE BIOGEL PI INDICATOR 7.0 (GLOVE) ×2
GLOVE BIOGEL PI INDICATOR 7.5 (GLOVE) ×2
GLOVE BIOGEL PI INDICATOR 8 (GLOVE) ×2
GLOVE ECLIPSE 6.5 STRL STRAW (GLOVE) ×4 IMPLANT
GLOVE SURG SS PI 7.5 STRL IVOR (GLOVE) ×4 IMPLANT
GOWN STRL REUS W/ TWL LRG LVL3 (GOWN DISPOSABLE) ×2 IMPLANT
GOWN STRL REUS W/TWL LRG LVL3 (GOWN DISPOSABLE) ×2
GOWN STRL REUS W/TWL XL LVL3 (GOWN DISPOSABLE) ×4 IMPLANT
NEEDLE HYPO 25X1 1.5 SAFETY (NEEDLE) ×4 IMPLANT
NS IRRIG 1000ML POUR BTL (IV SOLUTION) ×4 IMPLANT
PACK BASIN DAY SURGERY FS (CUSTOM PROCEDURE TRAY) ×4 IMPLANT
PAD CAST 3X4 CTTN HI CHSV (CAST SUPPLIES) IMPLANT
PAD CAST 4YDX4 CTTN HI CHSV (CAST SUPPLIES) IMPLANT
PADDING CAST ABS 4INX4YD NS (CAST SUPPLIES) ×2
PADDING CAST ABS COTTON 4X4 ST (CAST SUPPLIES) ×2 IMPLANT
PADDING CAST COTTON 3X4 STRL (CAST SUPPLIES)
PADDING CAST COTTON 4X4 STRL (CAST SUPPLIES)
SPLINT FINGER 3.25 911903 (SOFTGOODS) ×4 IMPLANT
STOCKINETTE 4X48 STRL (DRAPES) ×4 IMPLANT
STRIP CLOSURE SKIN 1/2X4 (GAUZE/BANDAGES/DRESSINGS) IMPLANT
SUT ETHILON 3 0 PS 1 (SUTURE) IMPLANT
SUT ETHILON 4 0 PS 2 18 (SUTURE) ×4 IMPLANT
SUT ETHILON 5 0 P 3 18 (SUTURE)
SUT NYLON ETHILON 5-0 P-3 1X18 (SUTURE) IMPLANT
SUT VIC AB 4-0 P2 18 (SUTURE) IMPLANT
SYR BULB 3OZ (MISCELLANEOUS) ×4 IMPLANT
SYR CONTROL 10ML LL (SYRINGE) ×4 IMPLANT
TOWEL OR 17X24 6PK STRL BLUE (TOWEL DISPOSABLE) ×8 IMPLANT
UNDERPAD 30X30 (UNDERPADS AND DIAPERS) ×4 IMPLANT

## 2017-01-02 NOTE — Transfer of Care (Signed)
Immediate Anesthesia Transfer of Care Note  Patient: Joshua Rivas  Procedure(s) Performed: Procedure(s): EXCISION MASS left long finger and debridement distal interphalangeal joint (Left)  Patient Location: PACU  Anesthesia Type:General  Level of Consciousness: awake, alert , oriented and patient cooperative  Airway & Oxygen Therapy: Patient Spontanous Breathing and Patient connected to face mask oxygen  Post-op Assessment: Report given to RN, Post -op Vital signs reviewed and stable and Patient moving all extremities  Post vital signs: Reviewed and stable  Last Vitals:  Vitals:   01/02/17 1224  BP: (!) 159/89  Pulse: 84  Resp: 18  Temp: 36.9 C    Last Pain:  Vitals:   01/02/17 1224  TempSrc: Oral         Complications: No apparent anesthesia complications

## 2017-01-02 NOTE — Anesthesia Preprocedure Evaluation (Addendum)
Anesthesia Evaluation  Patient identified by MRN, date of birth, ID band Patient awake    Reviewed: Allergy & Precautions, H&P , NPO status , Patient's Chart, lab work & pertinent test results  Airway Mallampati: II  TM Distance: >3 FB Neck ROM: Full    Dental  (+) Teeth Intact, Dental Advisory Given   Pulmonary neg pulmonary ROS,    Pulmonary exam normal breath sounds clear to auscultation       Cardiovascular negative cardio ROS Normal cardiovascular exam Rhythm:Regular Rate:Normal     Neuro/Psych negative neurological ROS  negative psych ROS   GI/Hepatic negative GI ROS, Neg liver ROS,   Endo/Other  negative endocrine ROS  Renal/GU negative Renal ROS     Musculoskeletal negative musculoskeletal ROS (+)   Abdominal (+) + obese,   Peds  Hematology negative hematology ROS (+)   Anesthesia Other Findings   Reproductive/Obstetrics                             Anesthesia Physical  Anesthesia Plan  ASA: II  Anesthesia Plan: General   Post-op Pain Management:    Induction: Intravenous  Airway Management Planned: LMA  Additional Equipment:   Intra-op Plan:   Post-operative Plan: Extubation in OR  Informed Consent: I have reviewed the patients History and Physical, chart, labs and discussed the procedure including the risks, benefits and alternatives for the proposed anesthesia with the patient or authorized representative who has indicated his/her understanding and acceptance.   Dental advisory given  Plan Discussed with: CRNA  Anesthesia Plan Comments:        Anesthesia Quick Evaluation

## 2017-01-02 NOTE — Anesthesia Procedure Notes (Signed)
Procedure Name: LMA Insertion Date/Time: 01/02/2017 1:58 PM Performed by: Curly ShoresRAFT, Tashai Catino W Pre-anesthesia Checklist: Patient identified, Emergency Drugs available, Suction available and Patient being monitored Patient Re-evaluated:Patient Re-evaluated prior to inductionOxygen Delivery Method: Circle system utilized Preoxygenation: Pre-oxygenation with 100% oxygen Intubation Type: IV induction Ventilation: Mask ventilation without difficulty LMA: LMA inserted LMA Size: 4.0 Number of attempts: 1 Airway Equipment and Method: Bite block Placement Confirmation: positive ETCO2 and breath sounds checked- equal and bilateral Tube secured with: Tape Dental Injury: Teeth and Oropharynx as per pre-operative assessment

## 2017-01-02 NOTE — H&P (Signed)
  Joshua Rivas is an 61 y.o. male.   Chief Complaint: left long finger mass HPI: 61 yo male with mass left long finger.  It has been present approximately 2 years.  He wishes to have it removed and the dip joint debrided.  Allergies: No Known Allergies  Past Medical History:  Diagnosis Date  . Cyst    Left groin  . Fecal impaction (HCC) 2010  . Groin cyst 03/2012   left    Past Surgical History:  Procedure Laterality Date  . CHOLECYSTECTOMY  10/11/2012   Procedure: CHOLECYSTECTOMY;  Surgeon: Mariella SaaBenjamin T Hoxworth, MD;  Location: [facility];  Service: General;  Laterality: N/A;  laparoscopic to open cholecystectomy    Family History: History reviewed. No pertinent family history.  Social History:   reports that he has never smoked. He has never used smokeless tobacco. He reports that he drinks alcohol. He reports that he does not use drugs.  Medications: Medications Prior to Admission  Medication Sig Dispense Refill  . acetaminophen (TYLENOL) 500 MG tablet Take 500 mg by mouth every 6 (six) hours as needed for pain.    Marland Kitchen. psyllium (HYDROCIL/METAMUCIL) 95 % PACK Take 1 packet by mouth every morning.      No results found for this or any previous visit (from the past 48 hour(s)).  No results found.   A comprehensive review of systems was negative.  Blood pressure (!) 159/89, pulse 84, temperature 98.4 F (36.9 C), temperature source Oral, resp. rate 18, height 5\' 9"  (1.753 m), weight 104.9 kg (231 lb 3.2 oz), SpO2 100 %.  General appearance: alert, cooperative and appears stated age Head: Normocephalic, without obvious abnormality, atraumatic Neck: supple, symmetrical, trachea midline Resp: clear to auscultation bilaterally Cardio: regular rate and rhythm GI: non-tender Extremities: Intact sensation and capillary refill all digits.  +epl/fpl/io.  No wounds.  Pulses: 2+ and symmetric Skin: Skin color, texture, turgor normal. 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  Non operative and operative treatment options were discussed with the patient and patient wishes to proceed with operative treatment. Risks, benefits, and alternatives of surgery were discussed and the patient agrees with the plan of care.   [name] 01/02/2017, 1:36 PM \

## 2017-01-02 NOTE — Anesthesia Postprocedure Evaluation (Signed)
Anesthesia Post Note  Patient: Joshua Rivas  Procedure(s) Performed: Procedure(s) (LRB): EXCISION MASS left long finger and debridement distal interphalangeal joint (Left)  Patient location during evaluation: PACU Anesthesia Type: General Level of consciousness: sedated and patient cooperative Pain management: pain level controlled Vital Signs Assessment: post-procedure vital signs reviewed and stable Respiratory status: spontaneous breathing Cardiovascular status: stable Anesthetic complications: no       Last Vitals:  Vitals:   01/02/17 1445 01/02/17 1515  BP: 117/75 (!) 129/91  Pulse: 76 (!) 59  Resp: 16 18  Temp:  36.6 C    Last Pain:  Vitals:   01/02/17 1515  TempSrc:   PainSc: 0-No pain                 [name]

## 2017-01-02 NOTE — Anesthesia Preprocedure Evaluation (Signed)
Anesthesia Evaluation Anesthesia Physical Anesthesia Plan Anesthesia Quick Evaluation  

## 2017-01-02 NOTE — Brief Op Note (Signed)
01/02/2017  2:29 PM  PATIENT:  Ozella Rocksobert S. Highbaugh  61 y.o. 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  POST-OPERATIVE DIAGNOSIS:  left long mucoid cyst and distal interphalageal arthritis  PROCEDURE:  Procedure(s): EXCISION MASS left long finger and debridement distal interphalangeal joint (Left)  SURGEON:  Surgeon(s) and Role:    * Betha LoaKevin Edmund Holcomb, MD - Primary  PHYSICIAN ASSISTANT:   ASSISTANTS: none   ANESTHESIA:   general  EBL:  Total I/O In: 1100 [I.V.:1100] Out: 1 [Blood:1]  BLOOD ADMINISTERED:none  DRAINS: none   LOCAL MEDICATIONS USED:  MARCAINE     SPECIMEN:  Source of Specimen:  left long finger  DISPOSITION OF SPECIMEN:  PATHOLOGY  COUNTS:  YES  TOURNIQUET:   Total Tourniquet Time Documented: Upper Arm (Left) - 15 minutes Total: Upper Arm (Left) - 15 minutes   DICTATION: .Other Dictation: Dictation Number [id number]  PLAN OF CARE: Discharge to home after PACU  PATIENT DISPOSITION:  PACU - hemodynamically stable.

## 2017-01-02 NOTE — Discharge Instructions (Addendum)
°  Post Anesthesia Home Care Instructions ° °Activity: °Get plenty of rest for the remainder of the day. A responsible adult should stay with you for 24 hours following the procedure.  °For the next 24 hours, DO NOT: °-Drive a car °-Operate machinery °-Drink alcoholic beverages °-Take any medication unless instructed by your physician °-Make any legal decisions or sign important papers. ° °Meals: °Start with liquid foods such as gelatin or soup. Progress to regular foods as tolerated. Avoid greasy, spicy, heavy foods. If nausea and/or vomiting occur, drink only clear liquids until the nausea and/or vomiting subsides. Call your physician if vomiting continues. ° °Special Instructions/Symptoms: °Your throat may feel dry or sore from the anesthesia or the breathing tube placed in your throat during surgery. If this causes discomfort, gargle with warm salt water. The discomfort should disappear within 24 hours. ° °If you had a scopolamine patch placed behind your ear for the management of post- operative nausea and/or vomiting: ° °1. The medication in the patch is effective for 72 hours, after which it should be removed.  Wrap patch in a tissue and discard in the trash. Wash hands thoroughly with soap and water. °2. You may remove the patch earlier than 72 hours if you experience unpleasant side effects which may include dry mouth, dizziness or visual disturbances. °3. Avoid touching the patch. Wash your hands with soap and water after contact with the patch. °  °Call your surgeon if you experience:  ° °1.  Fever over 101.0. °2.  Inability to urinate. °3.  Nausea and/or vomiting. °4.  Extreme swelling or bruising at the surgical site. °5.  Continued bleeding from the incision. °6.  Increased pain, redness or drainage from the incision. °7.  Problems related to your pain medication. °8.  Any problems and/or concernsHand Center Instructions °Hand Surgery ° °Wound Care: °Keep your hand elevated above the level of your  heart.  Do not allow it to dangle by your side.  Keep the dressing dry and do not remove it unless your doctor advises you to do so.  He will usually change it at the time of your post-op visit.  Moving your fingers is advised to stimulate circulation but will depend on the site of your surgery.  If you have a splint applied, your doctor will advise you regarding movement. ° °Activity: °Do not drive or operate machinery today.  Rest today and then you may return to your normal activity and work as indicated by your physician. ° °Diet:  °Drink liquids today or eat a light diet.  You may resume a regular diet tomorrow.   ° °General expectations: °Pain for two to three days. °Fingers may become slightly swollen. ° °Call your doctor if any of the following occur: °Severe pain not relieved by pain medication. °Elevated temperature. °Dressing soaked with blood. °Inability to move fingers. °White or bluish color to fingers. ° °

## 2017-01-02 NOTE — Op Note (Signed)
[id number] 

## 2017-01-03 ENCOUNTER — Encounter (HOSPITAL_BASED_OUTPATIENT_CLINIC_OR_DEPARTMENT_OTHER): Payer: Self-pay | Admitting: Orthopedic Surgery

## 2017-01-03 NOTE — Op Note (Signed)
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  PROCEDURE:   1. Left long finger excision of mucoid cyst 2. Left long finger debridement of distal interphalangeal joint including prominent radial condyle of the middle phalanx.  SURGEON:  Betha LoaKevin Abbie Jablon, MD.  ASSISTANT:  None.  ANESTHESIA:  General.  FLUIDS IV:  Per anesthesia flow sheet.  ESTIMATED BLOOD LOSS:  Minimal.  COMPLICATIONS:  None.  SPECIMENS:  Mucoid cyst to Pathology.  TOURNIQUET TIME:  15 minutes.  DISPOSITION:  Stable to PACU.  INDICATIONS:  Joshua Rivas is a 61 year old male, who has noted a mass in his left long finger for approximately 2 years.  It is bothersome to him.  He wished to have it removed and his DIP joint debrided, try and prevent recurrence.  Risks, benefits, and alternative of surgery were discussed including the risk of blood loss; infection; damage to nerves, vessels, tendons, ligaments, bone; failure to surgery and need for additional surgery complications with wound healing continued pain; and recurrence of mass.  He voiced understanding of these risks and elected to proceed.  OPERATIVE COURSE:  After being identified preoperatively by myself, the patient agreed upon procedure and site of procedure.  Surgical site was marked.  The risks, benefits, and alternatives of surgery were reviewed and he wished to proceed.  Surgical consent had been signed.  He was given IV Ancef as preoperative antibiotic prophylaxis.  He was transferred to the  operating room and placed on the operating room table in supine position on left upper extremity on arm board.  General anesthesia was induced by Anesthesiology.  Left upper extremity was prepped and draped in normal sterile orthopedic fashion.  Surgical pause was performed between surgeons, anesthesia, and operating staff and all were in agreement as to the patient, procedure and site of procedure. Tourniquet at the proximal aspect of the extremity was inflated to 250 mmHg after exsanguination of the limb with an Esmarch bandage.  A hockey- stick shaped incision was made at the radial side of the DIP joint of the left long finger.  This was carried through subcutaneous tissues by spreading technique.  The cyst was identified.  It was cleared, freed up of soft tissue attachments and removed.  It was sent to Pathology for examination.  The DIP joint was entered underneath the extensor tendon. It was debrided of synovium and the prominent dorsal aspect of the radial condyle of the middle phalanx, and was taken down with a rongeur. The wound was copiously irrigated with sterile saline.  It was then closed with 4-0 nylon in a horizontal mattress fashion.  A digital block was performed with 10 mL of 0.25% plain  Marcaine to aid in postoperative analgesia.  The wound was dressed with sterile Xeroform, 4 x 4, and wrapped with a Coban dressing lightly.  An AlumaFoam splint was placed and wrapped lightly with Coban dressing.  Tourniquet was deflated at 15 minutes.  Fingertips were pink with brisk capillary refill after deflation of tourniquet.  Operative drapes were broken down.  The patient was awoken from anesthesia safely.  He was transferred back to stretcher and taken to PACU in stable condition.  I will see him back in the office in 1 week for postoperative followup.  I will him Norco 5/325 one to two p.o. q.6 hours p.r.n. pain dispensed #20.     Betha Loa, MD     KK/MEDQ  D:   01/02/2017  T:  01/03/2017  Job:  [id number]

## 2017-01-29 ENCOUNTER — Other Ambulatory Visit: Payer: Self-pay | Admitting: Internal Medicine

## 2017-01-29 DIAGNOSIS — R1084 Generalized abdominal pain: Secondary | ICD-10-CM

## 2017-02-01 ENCOUNTER — Ambulatory Visit
Admission: RE | Admit: 2017-02-01 | Discharge: 2017-02-01 | Disposition: A | Discharge status: Home / Self Care | Payer: BC Managed Care – PPO | Source: Ambulatory Visit | Attending: Internal Medicine | Admitting: Internal Medicine

## 2017-02-01 DIAGNOSIS — R1084 Generalized abdominal pain: Secondary | ICD-10-CM

## 2017-06-30 ENCOUNTER — Emergency Department (HOSPITAL_COMMUNITY)
Admission: EM | Admit: 2017-06-30 | Discharge: 2017-06-30 | Disposition: A | Discharge status: Home / Self Care | Payer: BC Managed Care – PPO | Attending: Emergency Medicine | Admitting: Emergency Medicine

## 2017-06-30 ENCOUNTER — Encounter (HOSPITAL_COMMUNITY): Payer: Self-pay | Admitting: Emergency Medicine

## 2017-06-30 DIAGNOSIS — M5442 Lumbago with sciatica, left side: Secondary | ICD-10-CM | POA: Diagnosis present

## 2017-06-30 MED ORDER — DIAZEPAM 5 MG PO TABS
5.0000 mg | ORAL_TABLET | Freq: Once | ORAL | Status: AC
  Administered 2017-06-30: 5 mg via ORAL
  Filled 2017-06-30: qty 1

## 2017-06-30 MED ORDER — HYDROCODONE-ACETAMINOPHEN 5-325 MG PO TABS: 1.0000 | ORAL_TABLET | ORAL | 0 refills | Status: AC | PRN

## 2017-06-30 MED ORDER — METHOCARBAMOL 500 MG PO TABS: 500.0000 mg | ORAL_TABLET | Freq: Two times a day (BID) | ORAL | 0 refills | Status: AC

## 2017-06-30 MED ORDER — IBUPROFEN 600 MG PO TABS: 600.0000 mg | ORAL_TABLET | Freq: Four times a day (QID) | ORAL | 0 refills | Status: AC | PRN

## 2017-06-30 MED ORDER — OXYCODONE-ACETAMINOPHEN 5-325 MG PO TABS
1.0000 | ORAL_TABLET | Freq: Once | ORAL | Status: AC
  Administered 2017-06-30: 1 via ORAL
  Filled 2017-06-30: qty 1

## 2017-06-30 MED ORDER — DEXAMETHASONE SODIUM PHOSPHATE 10 MG/ML IJ SOLN
10.0000 mg | Freq: Once | INTRAMUSCULAR | Status: AC
  Administered 2017-06-30: 10 mg via INTRAMUSCULAR
  Filled 2017-06-30: qty 1

## 2017-06-30 NOTE — ED Provider Notes (Signed)
[facility]-EMERGENCY DEPT Provider Note   CSN: [id number] Arrival date & time: 06/30/17  0714     History   Chief Complaint Chief Complaint  Patient presents with  . Back Pain    HPI Joshua Rivas is a 61 y.o. male.  HPI   Patient is a 61 year old male with no pertinent past medical history presents the ED with complaints of left lower back pain. Patient reports over the past week he has had mild intermittent low back pain worse with movement that started after lifting his grandson. He notes this morning while laying on his couch, he had significant worsening of pain when he turned to to lay on his side. He reports having sharp cramping pain that starts in his left buttocks and radiates down the left side of his leg. Patient states pain is significantly worsened with movement. Reports taking ibuprofen at home without relief. Pt denies fever, numbness, tingling, saddle anesthesia, loss of bowel or bladder, weakness, CP, SOB, abdominal pain, N/V, diarrhea, constipation, urinary sxs, IVDU, cancer or recent spinal manipulation. Denies any other recent fall or injury.   Past Medical History:  Diagnosis Date  . Cyst    Left groin  . Fecal impaction (HCC) 2010  . Groin cyst 03/2012   left    Patient Active Problem List   Diagnosis Date Noted  . Cholecystitis with cholelithiasis 10/11/2012  . Groin cyst 04/08/2012    Past Surgical History:  Procedure Laterality Date  . CHOLECYSTECTOMY  10/11/2012   Procedure: CHOLECYSTECTOMY;  Surgeon: Mariella SaaBenjamin T Hoxworth, MD;  Location: [facility];  Service: General;  Laterality: N/A;  laparoscopic to open cholecystectomy  . MASS EXCISION Left 01/02/2017   Procedure: EXCISION MASS left long finger and debridement distal interphalangeal joint;  Surgeon: Betha LoaKevin Kuzma, MD;  Location: [facility];  Service: Orthopedics;  Laterality: Left;       Home Medications    Prior to Admission medications   Medication Sig Start Date End Date Taking?  Authorizing Provider  psyllium (HYDROCIL/METAMUCIL) 95 % PACK Take 1 packet by mouth every morning.   Yes [provider]  HYDROcodone-acetaminophen (NORCO/VICODIN) 5-325 MG tablet Take 1 tablet by mouth every 4 (four) hours as needed. 06/30/17   Barrett HenleNadeau, Anirudh Baiz Elizabeth, PA-C  ibuprofen (ADVIL,MOTRIN) 600 MG tablet Take 1 tablet (600 mg total) by mouth every 6 (six) hours as needed. 06/30/17   Barrett HenleNadeau, Shane Melby Elizabeth, PA-C  methocarbamol (ROBAXIN) 500 MG tablet Take 1 tablet (500 mg total) by mouth 2 (two) times daily. 06/30/17   Barrett HenleNadeau, Charlottie Peragine Elizabeth, PA-C    Family History No family history on file.  Social History Social History  Substance Use Topics  . Smoking status: Never Smoker  . Smokeless tobacco: Never Used  . Alcohol use Yes     Comment: occas     Allergies   Patient has no known allergies.   Review of Systems Review of Systems  Musculoskeletal: Positive for back pain.  All other systems reviewed and are negative.    Physical Exam Updated Vital Signs BP (!) 167/97 (BP Location: Left Arm)   Pulse 86   Temp 98 F (36.7 C) (Oral)   Resp 17   SpO2 100%   Physical Exam  Constitutional: He is oriented to person, place, and time. He appears well-developed and well-nourished.  HENT:  Head: Normocephalic and atraumatic.  Eyes: Conjunctivae and EOM are normal. Right eye exhibits no discharge. Left eye exhibits no discharge. No scleral icterus.  Neck: Normal  range of motion. Neck supple.  Cardiovascular: Normal rate, regular rhythm, normal heart sounds and intact distal pulses.   Pulmonary/Chest: Effort normal and breath sounds normal. No respiratory distress. He has no wheezes. He has no rales. He exhibits no tenderness.  Abdominal: Soft. Bowel sounds are normal. He exhibits no distension and no mass. There is no tenderness. There is no rebound and no guarding.  Musculoskeletal: He exhibits tenderness. He exhibits no edema or deformity.  No midline C, T, or  L tenderness. Mild TTP over left lower lumbar paraspinal muscles and left buttocks. Full range of motion of neck and dec ROM of back due to pain. Positive left straight leg raise. Full range of motion of bilateral upper and lower extremities, with 5/5 strength. Sensation intact. 2+ radial and PT pulses. Cap refill <2 seconds.   Neurological: He is alert and oriented to person, place, and time. He has normal strength. He displays normal reflexes. No sensory deficit.  Skin: Skin is warm and dry.  Nursing note and vitals reviewed.    ED Treatments / Results  Labs (all labs ordered are listed, but only abnormal results are displayed) Labs Reviewed - No data to display  EKG  EKG Interpretation None       Radiology No results found.  Procedures Procedures (including critical care time)  Medications Ordered in ED Medications  oxyCODONE-acetaminophen (PERCOCET/ROXICET) 5-325 MG per tablet 1 tablet (1 tablet Oral Given 06/30/17 0817)  diazepam (VALIUM) tablet 5 mg (5 mg Oral Given 06/30/17 0817)  dexamethasone (DECADRON) injection 10 mg (10 mg Intramuscular Given 06/30/17 0817)     Initial Impression / Assessment and Plan / ED Course  I have reviewed the triage vital signs and the nursing notes.  Pertinent labs & imaging results that were available during my care of the patient were reviewed by me and considered in my medical decision making (see chart for details).     Patient presents with gradually worsening left lower back pain radiating into his left leg over the past week after he lifted his grandson. VSS. Normal neurological exam, no evidence of urinary incontinence or retention. Positive left straight leg raise. No loss of bowel or bladder control. No midline spinal tenderness.  No concern for cauda equina.  No fever, night sweats, weight loss, h/o cancer, IVDU.  Pain treated here in the department with improvement. On reevaluation patient denies having any back pain, patient is able  to stand and ambulate without assistance. Suspect patient's symptoms are likely due to left-sided sciatica/muscle strain. Plan to discharge patient home with RICE protocol and pain medicine and muscle relaxant. Advised patient to follow up with PCP within the next week for follow-up evaluation as needed. I have also discussed reasons to return immediately to the ER.  Patient expresses understanding and agrees with plan.    Final Clinical Impressions(s) / ED Diagnoses   Final diagnoses:  Acute left-sided low back pain with left-sided sciatica    New Prescriptions New Prescriptions   HYDROCODONE-ACETAMINOPHEN (NORCO/VICODIN) 5-325 MG TABLET    Take 1 tablet by mouth every 4 (four) hours as needed.   IBUPROFEN (ADVIL,MOTRIN) 600 MG TABLET    Take 1 tablet (600 mg total) by mouth every 6 (six) hours as needed.   METHOCARBAMOL (ROBAXIN) 500 MG TABLET    Take 1 tablet (500 mg total) by mouth 2 (two) times daily.     Barrett Henle, PA-C 06/30/17 5784    Charlynne Pander, MD 06/30/17 [phone]

## 2017-06-30 NOTE — ED Triage Notes (Signed)
Patient here from home with complaints of lower left back pain radiating down into hip down leg x1 week increased last night. Denies chest pain.

## 2017-06-30 NOTE — Discharge Instructions (Signed)
Take your medications as prescribed. You may also apply ice and/or heat to affected area for 15-20 minutes 3-4 times daily for additional pain relief. I recommend refraining from doing any heavy lifting, squatting or repetitive movements that exacerbate her symptoms for the next few days. °Follow-up with your primary care provider in the next week if your symptoms have not improved. °Please return to the Emergency Department if symptoms worsen or new onset of denies fever, numbness, tingling, groin anesthesia, loss of bowel or bladder, weakness, chest pain, abdominal pain, vomiting.  °

## 2017-07-04 ENCOUNTER — Ambulatory Visit
Admission: RE | Admit: 2017-07-04 | Discharge: 2017-07-04 | Disposition: A | Discharge status: Home / Self Care | Payer: BC Managed Care – PPO | Source: Ambulatory Visit | Attending: Internal Medicine | Admitting: Internal Medicine

## 2017-07-04 ENCOUNTER — Other Ambulatory Visit: Payer: Self-pay | Admitting: Internal Medicine

## 2017-07-04 DIAGNOSIS — M545 Low back pain: Secondary | ICD-10-CM

## 2017-07-11 ENCOUNTER — Other Ambulatory Visit: Payer: Self-pay | Admitting: Nurse Practitioner

## 2017-07-11 DIAGNOSIS — M545 Low back pain, unspecified: Secondary | ICD-10-CM

## 2017-07-23 ENCOUNTER — Ambulatory Visit
Admission: RE | Admit: 2017-07-23 | Discharge: 2017-07-23 | Disposition: A | Discharge status: Home / Self Care | Payer: BC Managed Care – PPO | Source: Ambulatory Visit | Attending: Nurse Practitioner | Admitting: Nurse Practitioner

## 2017-07-23 DIAGNOSIS — M545 Low back pain, unspecified: Secondary | ICD-10-CM

## 2017-07-24 ENCOUNTER — Other Ambulatory Visit: Payer: BC Managed Care – PPO

## 2018-06-28 IMAGING — DX DG LUMBAR SPINE 2-3V
3 series · 3 of 3 positions shown · non-contrast
Comparison: None.

CLINICAL DATA: Lumbago for several days

EXAM:
LUMBAR SPINE - 2-3 VIEW

[dg lumbar spine 2-3 views (1 of 3)]
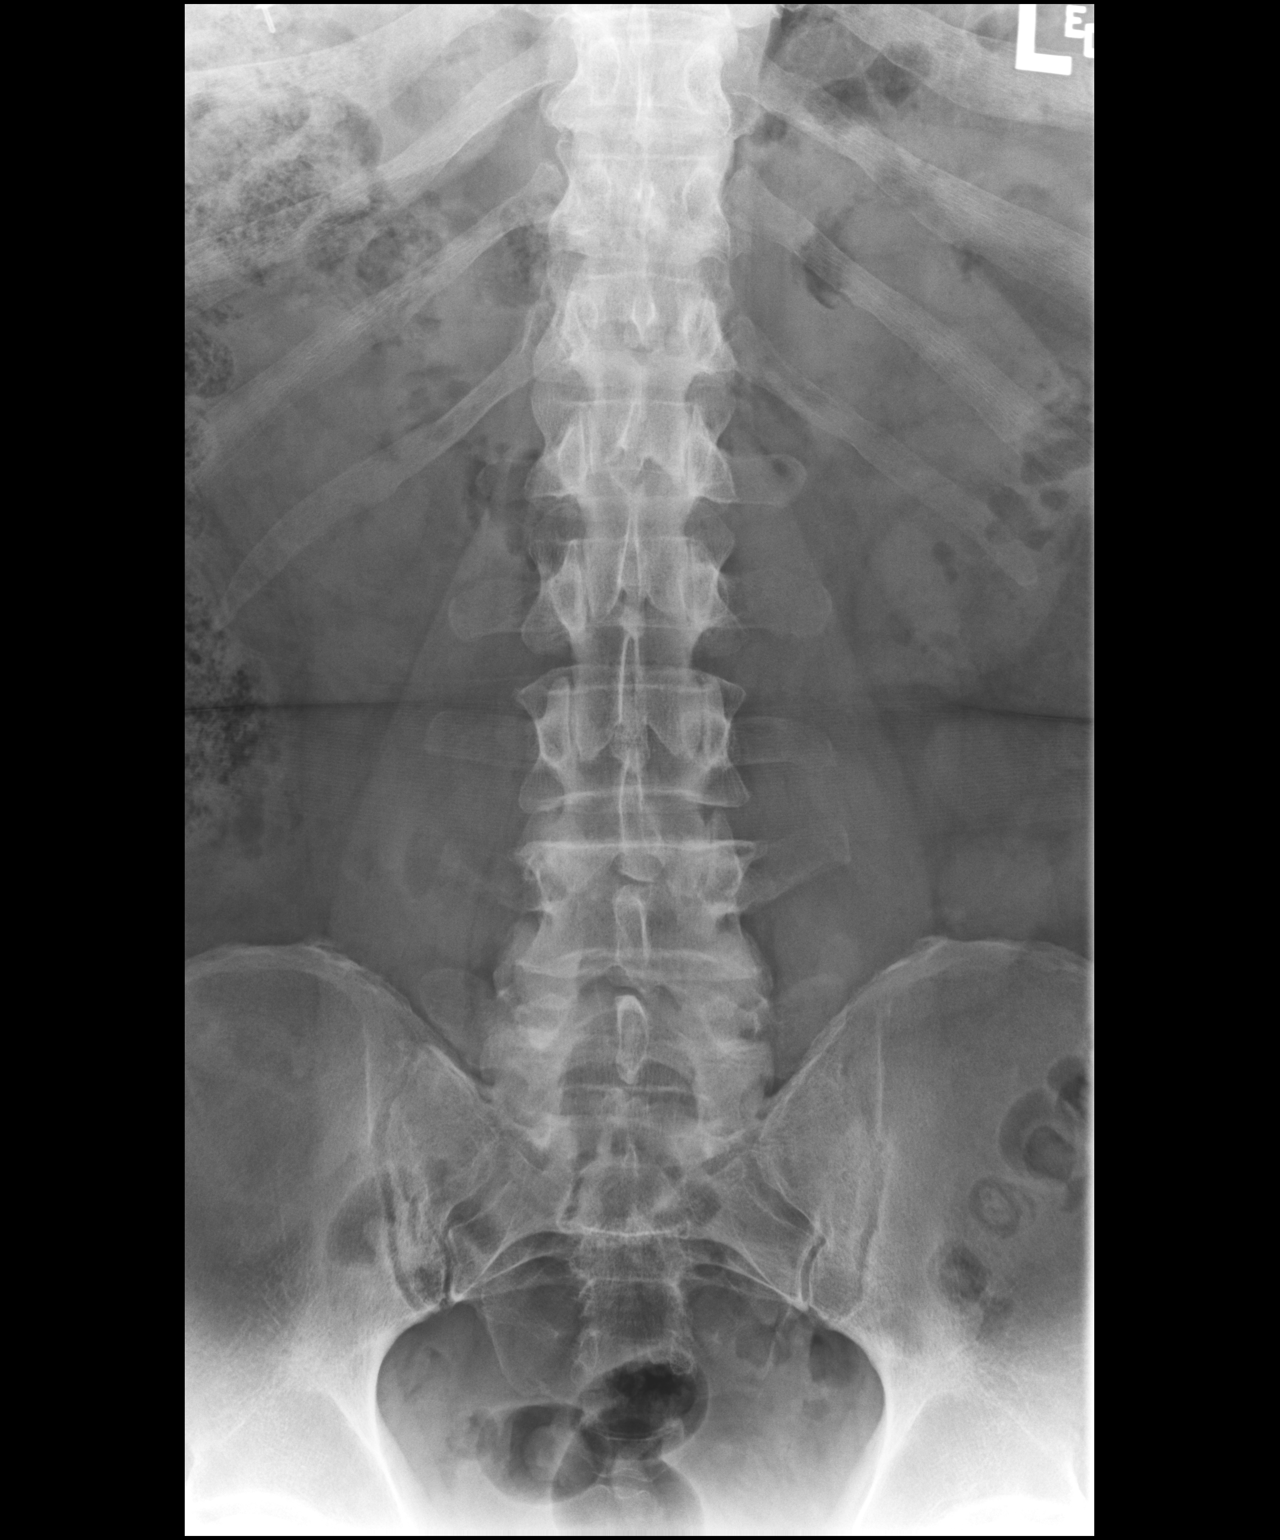

[dg lumbar spine 2-3 views (2 of 3)]
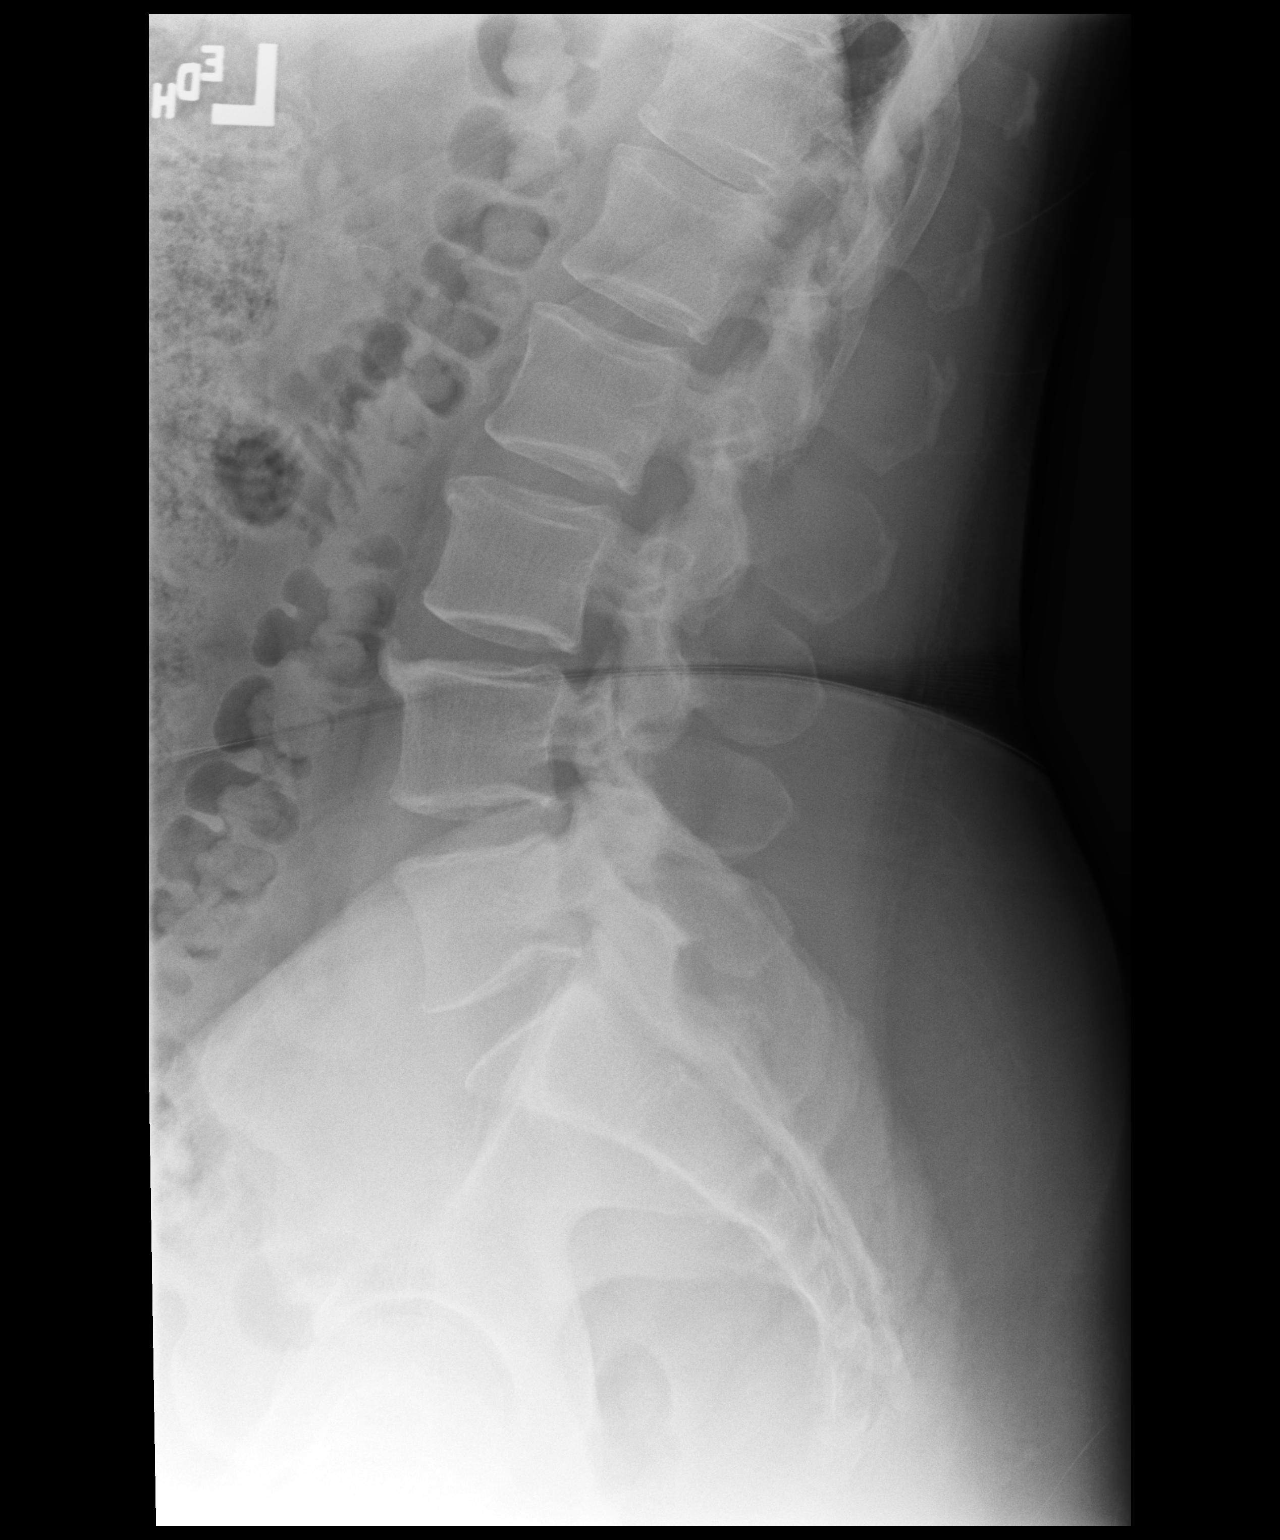

[dg lumbar spine 2-3 views (3 of 3)]
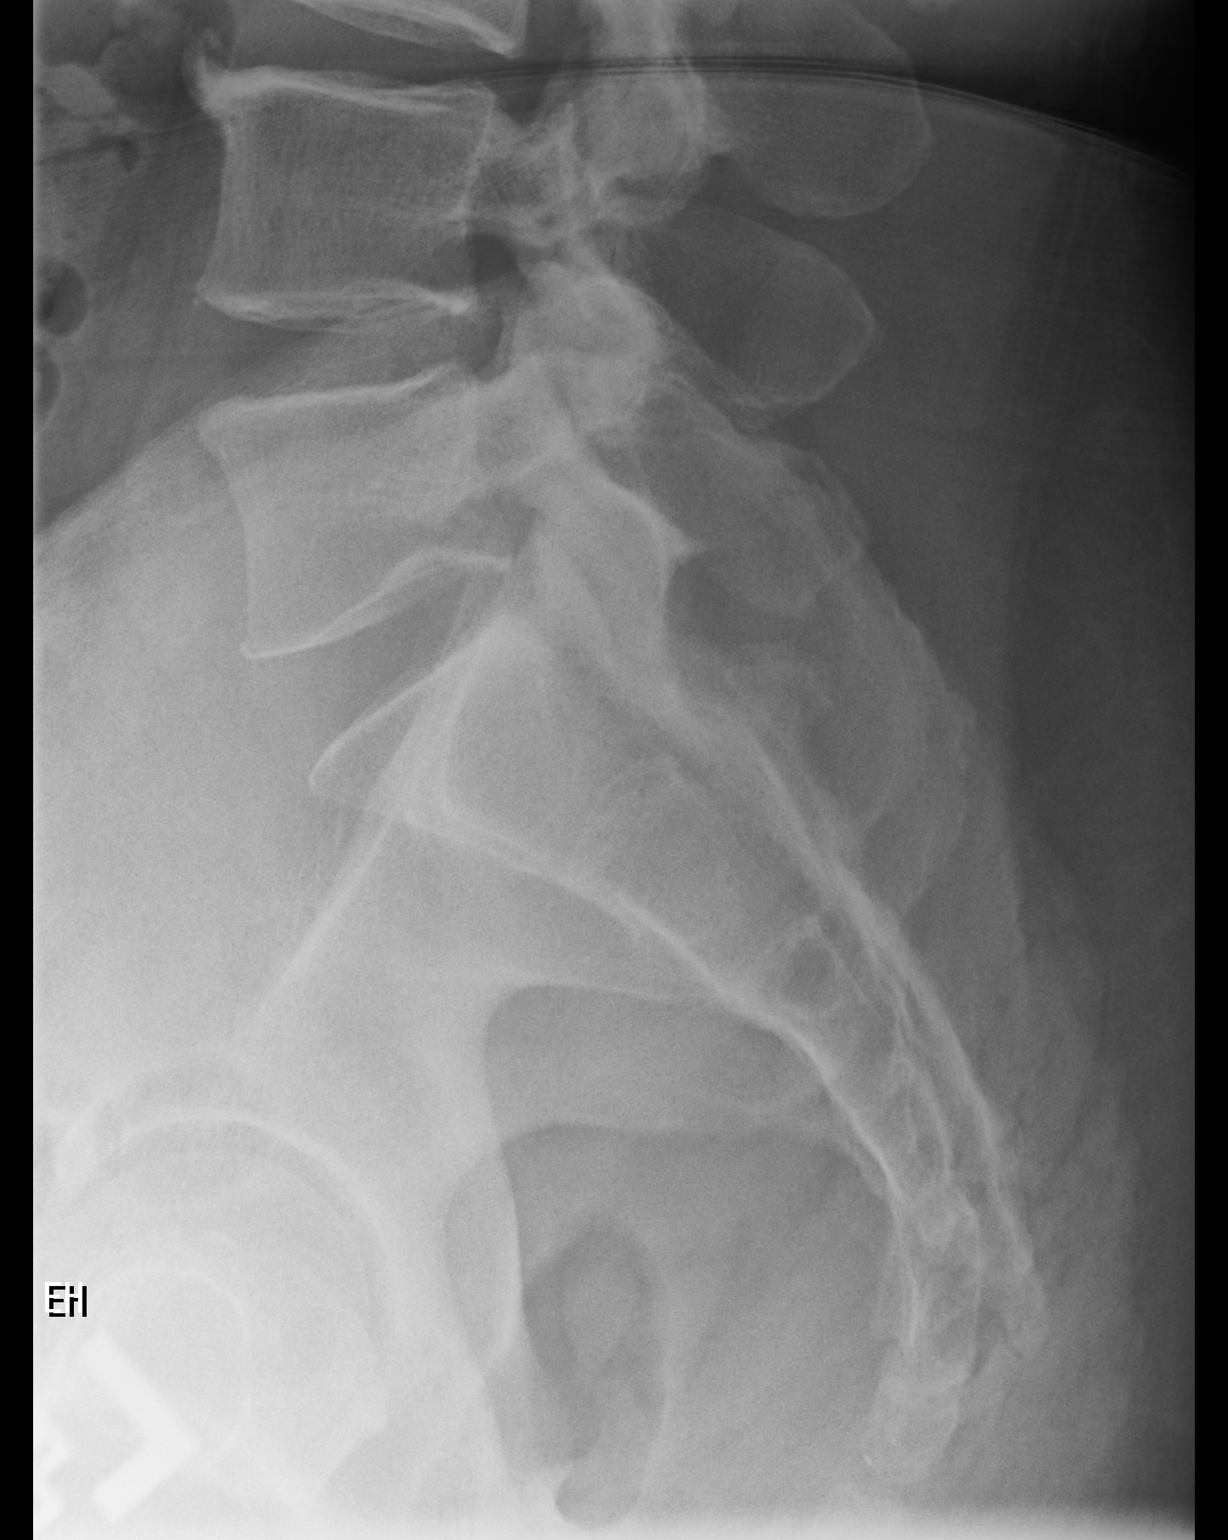

[3 of 3 positions shown; findings below may reference images not displayed]

FINDINGS: Frontal, lateral, and spot lumbosacral lateral images were obtained.
There are 5 non-rib-bearing lumbar type vertebral bodies. There is
no fracture or spondylolisthesis. Disc spaces appear normal. There
is a prominent anterior osteophyte at L4 superiorly. No erosive
change.
IMPRESSION: No fracture or spondylolisthesis. No appreciable disc space
narrowing.

## 2018-07-29 ENCOUNTER — Other Ambulatory Visit: Payer: Self-pay | Admitting: Gastroenterology

## 2018-07-29 DIAGNOSIS — R109 Unspecified abdominal pain: Secondary | ICD-10-CM

## 2018-07-31 ENCOUNTER — Encounter: Payer: Self-pay | Admitting: Skilled Nursing Facility1

## 2018-07-31 ENCOUNTER — Encounter: Payer: BC Managed Care – PPO | Attending: Internal Medicine | Admitting: Skilled Nursing Facility1

## 2018-07-31 DIAGNOSIS — Z6830 Body mass index (BMI) 30.0-30.9, adult: Secondary | ICD-10-CM | POA: Insufficient documentation

## 2018-07-31 DIAGNOSIS — E669 Obesity, unspecified: Secondary | ICD-10-CM

## 2018-07-31 DIAGNOSIS — Z713 Dietary counseling and surveillance: Secondary | ICD-10-CM | POA: Insufficient documentation

## 2018-07-31 NOTE — Progress Notes (Signed)
Appt Time: 4:36-6:26 Assessment:  Primary concerns today: obesity.   [place] the weight back after moving to [place]. Pt does report stress eating. Pt states he has maintained his weight since 2012. Pt states he wants to be 180 pounds. Pt states he struggles with motivation. Pt states his parents were chronic dieters and his father struggled with alcoholism. Pt states he snacks throughout the day and at night due to boredom and work stress.  Pt spent the appt using the [place] weight loss program as a guide.    MEDICATIONS: See List   DIETARY INTAKE:  Usual eating pattern includes 3 meals and 4 snacks per day.  Everyday foods include none stated.  Avoided foods include none stated.    24-hr recall:  B (7 AM): english muffin with jam and black coffee Snk ( AM):  L ( PM): subway with chips or jimmy johns with chips or other fast food Snk ( PM): chocolate covered raisins  D ( PM): 2 poached eggs on english muffin or ham sandwich or hamburger or seafood Snk ( PM): salty snacks  Beverages: diet soda, plain water   Usual physical activity: ADL's  Estimated energy needs: 1600 calories 180 g carbohydrates 120 g protein 44 g fat  Progress Towards Goal(s):  In progress.   Nutritional Diagnosis:  Springer-3.3 Overweight/obesity As related to excessive snacking .  As evidenced by pt recall. BMI 32.93    Intervention:  Nutrition counseling for weight management. Goals: -Create your daily spread sheet goal with the check system like you have done in the past -Aim for 3 days a week 40 minutes walking [place] the golf [place] -Create your rock that looks like a liver to remind you of the fatty liver as a motivation -Aim for non-starchy vegetables 2 times 7 days a week -Have a protein with breakfast -Use your should I eat sheet and needs/emotions sheet to determine when you are about to emotionally eat and fill those  needs/emotions appropriately   Teaching Method Utilized:  Visual Auditory Hands on  Handouts given during visit include:  Should I eat  Needs sheet   emotions sheet  Barriers to learning/adherence to lifestyle change: emotional eating   Demonstrated degree of understanding via:  Teach Back   Monitoring/Evaluation:  Dietary intake, exerciseand body weight prn.

## 2018-08-07 ENCOUNTER — Ambulatory Visit
Admission: RE | Admit: 2018-08-07 | Discharge: 2018-08-07 | Disposition: A | Discharge status: Home / Self Care | Payer: BC Managed Care – PPO | Source: Ambulatory Visit | Attending: Gastroenterology | Admitting: Gastroenterology

## 2018-08-07 DIAGNOSIS — R109 Unspecified abdominal pain: Secondary | ICD-10-CM

## 2018-08-07 MED ORDER — IOPAMIDOL (ISOVUE-300) INJECTION 61%
100.0000 mL | Freq: Once | INTRAVENOUS | Status: AC | PRN
Start: 2018-08-07 — End: 2018-08-07
  Administered 2018-08-07: 100 mL via INTRAVENOUS

## 2018-08-28 ENCOUNTER — Ambulatory Visit: Payer: BC Managed Care – PPO | Admitting: Skilled Nursing Facility1

## 2022-01-10 ENCOUNTER — Other Ambulatory Visit: Payer: Self-pay

## 2022-01-10 ENCOUNTER — Ambulatory Visit (INDEPENDENT_AMBULATORY_CARE_PROVIDER_SITE_OTHER): Payer: 59

## 2022-01-10 ENCOUNTER — Ambulatory Visit (INDEPENDENT_AMBULATORY_CARE_PROVIDER_SITE_OTHER): Payer: 59 | Admitting: Orthopedic Surgery

## 2022-01-10 DIAGNOSIS — M67449 Ganglion, unspecified hand: Secondary | ICD-10-CM

## 2022-01-10 DIAGNOSIS — M1812 Unilateral primary osteoarthritis of first carpometacarpal joint, left hand: Secondary | ICD-10-CM | POA: Diagnosis not present

## 2022-01-10 DIAGNOSIS — M67442 Ganglion, left hand: Secondary | ICD-10-CM

## 2022-01-10 NOTE — Progress Notes (Signed)
Office Visit Note   Patient: Joshua Rivas           Date of Birth: [birth year]           MRN: [id number] Visit Date: 01/10/2022              Requested by: Wenda Low, MD [place] [employer] [facility] [employer] [place] [address] [place],  [place] [id number] PCP: Wenda Low, MD   Assessment & Plan: Visit Diagnoses:  1. Mucous cyst of digit of hand   2. Arthritis of carpometacarpal (CMC) joint of left thumb     Plan: We discussed the diagnosis, prognosis, and both conservative and operative treatment options for his mucous cyst..  After our discussion, the patient has elected to proceed with revision surgical excision.  We reviewed the benefits of surgery and the potential risks including, but not limited to, persistent symptoms, infection, damage to nearby nerves and blood vessels, delayed wound healing, recurrence, need for additional surgery, injury to terminal extensor tendon.    Regarding his [place] arthritis, it does not bother him enough to have it addressed at this point.  He will continue to monitor his symptoms for now.   All patient concerns and questions were addressed.  A surgical date will be confirmed with the patient.    Follow-Up Instructions: No follow-ups on file.   Orders:  Orders Placed This Encounter  Procedures   XR Hand Complete Left   DG Finger Middle Left   No orders of the defined types were placed in this encounter.     Procedures: No procedures performed   Clinical Data: No additional findings.   Subjective: Chief Complaint  Patient presents with   Left Hand - Cyst    Cyst on distal Left middle finger, noticed it coming back x 1 year ago, but it was really tiny then, it has been growing, it is the size that it was when he had it removed in 2017. Hurts when he bumps it, he does put a bandaid over it when he is doing things at home.    This is a 66 year old right-hand-dominant male who presents with a recurrent left middle finger mucous cyst.  He had the cyst  surgically excised in November 2019.  The cyst seems to have recurred within the last year.  He has pain when he accidentally bumps this finger.  He typically puts a bandage on it to prevent him from doing so.  He also describes pain at the base of the left thumb.  This is relatively mild and only hurts him from time to time.  Not taking any medication or done any treatment for his basilar thumb pain.   Review of Systems   Objective: Vital Signs: There were no vitals taken for this visit.  Physical Exam Constitutional:      Appearance: Normal appearance.  Cardiovascular:     Rate and Rhythm: Normal rate.     Pulses: Normal pulses.  Pulmonary:     Effort: Pulmonary effort is normal.  Skin:    General: Skin is warm and dry.     Capillary Refill: Capillary refill takes less than 2 seconds.  Neurological:     Mental Status: He is alert.    Right Hand Exam   Tenderness  Right hand tenderness location: TTP directly over mucous cyst at radial aspect of middle finger DIP joint.  Minimal TTP at base of thumb.  Range of Motion  The patient has normal right wrist ROM.  Muscle Strength  The patient has normal right wrist strength.  Other  Erythema: absent Sensation: normal Pulse: present  Comments:  Mucous cyst at radial aspect of middle finger DIP joint w/ well healed incision from prior treatment.  No pain w/ ROM of the DIP joint.  + CMC grind test without static or dynamic changes at the MP joint.      Specialty Comments:  No specialty comments available.  Imaging: No results found.   PMFS History: Patient Active Problem List   Diagnosis Date Noted   Cholecystitis with cholelithiasis 10/11/2012   Groin cyst 04/08/2012   Past Medical History:  Diagnosis Date   Cyst    Left groin   Fecal impaction ([place]) 2010   Groin cyst 03/2012   left    No family history on file.  Past Surgical History:  Procedure Laterality Date   CHOLECYSTECTOMY  10/11/2012   Procedure:  CHOLECYSTECTOMY;  Surgeon: Edward Jolly, MD;  Location: [facility];  Service: General;  Laterality: N/A;  laparoscopic to open cholecystectomy   MASS EXCISION Left 01/02/2017   Procedure: EXCISION MASS left long finger and debridement distal interphalangeal joint;  Surgeon: Leanora Cover, MD;  Location: [facility];  Service: Orthopedics;  Laterality: Left;   Social History   Occupational History   Not on file  Tobacco Use   Smoking status: Never   Smokeless tobacco: Never  Substance and Sexual Activity   Alcohol use: Yes    Comment: occas   Drug use: No   Sexual activity: Never

## 2022-01-10 NOTE — H&P (View-Only) (Signed)
Office Visit Note   Patient: Joshua Rivas           Date of Birth: Aug 23, 1956           MRN: [id number] Visit Date: 01/10/2022              Requested by: Wenda Low, MD [place] [employer] [place] [address],  [place] [address] PCP: Wenda Low, MD   Assessment & Plan: Visit Diagnoses:  1. Mucous cyst of digit of hand   2. Arthritis of carpometacarpal (CMC) joint of left thumb     Plan: We discussed the diagnosis, prognosis, and both conservative and operative treatment options for his mucous cyst..  After our discussion, the patient has elected to proceed with revision surgical excision.  We reviewed the benefits of surgery and the potential risks including, but not limited to, persistent symptoms, infection, damage to nearby nerves and blood vessels, delayed wound healing, recurrence, need for additional surgery, injury to terminal extensor tendon.    Regarding his [place] arthritis, it does not bother him enough to have it addressed at this point.  He will continue to monitor his symptoms for now.   All patient concerns and questions were addressed.  A surgical date will be confirmed with the patient.    Follow-Up Instructions: No follow-ups on file.   Orders:  Orders Placed This Encounter  Procedures   XR Hand Complete Left   DG Finger Middle Left   No orders of the defined types were placed in this encounter.     Procedures: No procedures performed   Clinical Data: No additional findings.   Subjective: Chief Complaint  Patient presents with   Left Hand - Cyst    Cyst on distal Left middle finger, noticed it coming back x 1 year ago, but it was really tiny then, it has been growing, it is the size that it was when he had it removed in 2017. Hurts when he bumps it, he does put a bandaid over it when he is doing things at home.    This is a 66 year old right-hand-dominant male who presents with a recurrent left middle finger mucous cyst.  He had the cyst  surgically excised in November 2019.  The cyst seems to have recurred within the last year.  He has pain when he accidentally bumps this finger.  He typically puts a bandage on it to prevent him from doing so.  He also describes pain at the base of the left thumb.  This is relatively mild and only hurts him from time to time.  Not taking any medication or done any treatment for his basilar thumb pain.   Review of Systems   Objective: Vital Signs: There were no vitals taken for this visit.  Physical Exam Constitutional:      Appearance: Normal appearance.  Cardiovascular:     Rate and Rhythm: Normal rate.     Pulses: Normal pulses.  Pulmonary:     Effort: Pulmonary effort is normal.  Skin:    General: Skin is warm and dry.     Capillary Refill: Capillary refill takes less than 2 seconds.  Neurological:     Mental Status: He is alert.    Right Hand Exam   Tenderness  Right hand tenderness location: TTP directly over mucous cyst at radial aspect of middle finger DIP joint.  Minimal TTP at base of thumb.  Range of Motion  The patient has normal right wrist ROM.  Muscle Strength  The patient has normal right wrist strength.  Other  Erythema: absent Sensation: normal Pulse: present  Comments:  Mucous cyst at radial aspect of middle finger DIP joint w/ well healed incision from prior treatment.  No pain w/ ROM of the DIP joint.  + CMC grind test without static or dynamic changes at the MP joint.      Specialty Comments:  No specialty comments available.  Imaging: No results found.   PMFS History: Patient Active Problem List   Diagnosis Date Noted   Cholecystitis with cholelithiasis 10/11/2012   Groin cyst 04/08/2012   Past Medical History:  Diagnosis Date   Cyst    Left groin   Fecal impaction ([place]) 2010   Groin cyst 03/2012   left    No family history on file.  Past Surgical History:  Procedure Laterality Date   CHOLECYSTECTOMY  10/11/2012   Procedure:  CHOLECYSTECTOMY;  Surgeon: Edward Jolly, MD;  Location: [facility];  Service: General;  Laterality: N/A;  laparoscopic to open cholecystectomy   MASS EXCISION Left 01/02/2017   Procedure: EXCISION MASS left long finger and debridement distal interphalangeal joint;  Surgeon: Leanora Cover, MD;  Location: [facility];  Service: Orthopedics;  Laterality: Left;   Social History   Occupational History   Not on file  Tobacco Use   Smoking status: Never   Smokeless tobacco: Never  Substance and Sexual Activity   Alcohol use: Yes    Comment: occas   Drug use: No   Sexual activity: Never

## 2022-01-13 ENCOUNTER — Telehealth: Payer: Self-pay | Admitting: Orthopaedic Surgery

## 2022-01-13 NOTE — Telephone Encounter (Signed)
I spoke with patient, and advised once I received surgery sheet we can proceed with scheduling surgery. Patient would like to possible schedule for Jan. 30th or Feb.1st, but will confirm dates once he speaks with his spouse. Patient was given the phone number for the [facility] to discuss surgical cost.

## 2022-01-13 NOTE — Telephone Encounter (Signed)
Pt called stating he had an appt on 01/10/22 and was told someone would call him in regards to the cost of a cyst removal. Pt would like a CB to discuss this please.   [phone]

## 2022-01-16 NOTE — Telephone Encounter (Signed)
This is a Dr. Tempie Donning patient.

## 2022-01-23 ENCOUNTER — Encounter (HOSPITAL_BASED_OUTPATIENT_CLINIC_OR_DEPARTMENT_OTHER): Payer: Self-pay | Admitting: Orthopedic Surgery

## 2022-01-23 ENCOUNTER — Other Ambulatory Visit: Payer: Self-pay

## 2022-01-27 ENCOUNTER — Encounter (HOSPITAL_BASED_OUTPATIENT_CLINIC_OR_DEPARTMENT_OTHER)
Admission: RE | Admit: 2022-01-27 | Discharge: 2022-01-27 | Disposition: A | Discharge status: Home / Self Care | Payer: 59 | Source: Ambulatory Visit | Attending: Orthopedic Surgery | Admitting: Orthopedic Surgery

## 2022-01-27 DIAGNOSIS — I10 Essential (primary) hypertension: Secondary | ICD-10-CM | POA: Diagnosis not present

## 2022-01-27 DIAGNOSIS — Z01818 Encounter for other preprocedural examination: Secondary | ICD-10-CM | POA: Insufficient documentation

## 2022-01-27 LAB — BASIC METABOLIC PANEL
Anion gap: 11 (ref 5–15)
BUN: 14 mg/dL (ref 8–23)
CO2: 23 mmol/L (ref 22–32)
Calcium: 9.4 mg/dL (ref 8.9–10.3)
Chloride: 105 mmol/L (ref 98–111)
Creatinine, Ser: 1.09 mg/dL (ref 0.61–1.24)
GFR, Estimated: >60 mL/min (ref >60)
Glucose, Bld: 100 mg/dL — ABNORMAL HIGH (ref 70–99)
Potassium: 4.5 mmol/L (ref 3.5–5.1)
Sodium: 139 mmol/L (ref 135–145)

## 2022-02-01 ENCOUNTER — Other Ambulatory Visit: Payer: Self-pay

## 2022-02-01 ENCOUNTER — Ambulatory Visit (HOSPITAL_BASED_OUTPATIENT_CLINIC_OR_DEPARTMENT_OTHER): Payer: 59 | Admitting: Anesthesiology

## 2022-02-01 ENCOUNTER — Encounter (HOSPITAL_BASED_OUTPATIENT_CLINIC_OR_DEPARTMENT_OTHER)
Admission: RE | Disposition: A | Discharge status: Home / Self Care | Payer: Self-pay | Source: Home / Self Care | Attending: Orthopedic Surgery

## 2022-02-01 ENCOUNTER — Ambulatory Visit (HOSPITAL_BASED_OUTPATIENT_CLINIC_OR_DEPARTMENT_OTHER)
Admission: RE | Admit: 2022-02-01 | Discharge: 2022-02-01 | Disposition: A | Discharge status: Home / Self Care | Payer: 59 | Attending: Orthopedic Surgery | Admitting: Orthopedic Surgery

## 2022-02-01 ENCOUNTER — Encounter (HOSPITAL_BASED_OUTPATIENT_CLINIC_OR_DEPARTMENT_OTHER): Payer: Self-pay | Admitting: Orthopedic Surgery

## 2022-02-01 DIAGNOSIS — M1812 Unilateral primary osteoarthritis of first carpometacarpal joint, left hand: Secondary | ICD-10-CM | POA: Diagnosis not present

## 2022-02-01 DIAGNOSIS — I10 Essential (primary) hypertension: Secondary | ICD-10-CM | POA: Insufficient documentation

## 2022-02-01 DIAGNOSIS — M67449 Ganglion, unspecified hand: Secondary | ICD-10-CM

## 2022-02-01 DIAGNOSIS — M67442 Ganglion, left hand: Secondary | ICD-10-CM

## 2022-02-01 DIAGNOSIS — M25842 Other specified joint disorders, left hand: Secondary | ICD-10-CM | POA: Insufficient documentation

## 2022-02-01 HISTORY — DX: Essential (primary) hypertension: I10

## 2022-02-01 HISTORY — PX: CYST EXCISION: SHX5701

## 2022-02-01 SURGERY — CYST REMOVAL
Anesthesia: General | Site: Finger | Laterality: Left

## 2022-02-01 MED ORDER — ONDANSETRON HCL 4 MG/2ML IJ SOLN: 4.0000 mg | Freq: Once | INTRAMUSCULAR | Status: DC | PRN

## 2022-02-01 MED ORDER — LIDOCAINE HCL 1 % IJ SOLN
INTRAMUSCULAR | Status: DC | PRN
  Administered 2022-02-01: 5 mL

## 2022-02-01 MED ORDER — LACTATED RINGERS IV SOLN: INTRAVENOUS | Status: DC

## 2022-02-01 MED ORDER — BUPIVACAINE HCL (PF) 0.25 % IJ SOLN
INTRAMUSCULAR | Status: DC | PRN
  Administered 2022-02-01: 5 mL

## 2022-02-01 MED ORDER — MIDAZOLAM HCL 2 MG/2ML IJ SOLN
INTRAMUSCULAR | Status: AC
  Filled 2022-02-01: qty 2

## 2022-02-01 MED ORDER — FENTANYL CITRATE (PF) 100 MCG/2ML IJ SOLN: 25.0000 ug | INTRAMUSCULAR | Status: DC | PRN

## 2022-02-01 MED ORDER — ONDANSETRON HCL 4 MG/2ML IJ SOLN
INTRAMUSCULAR | Status: DC | PRN
Start: 2022-02-01 — End: 2022-02-01
  Administered 2022-02-01: 4 mg via INTRAVENOUS

## 2022-02-01 MED ORDER — FENTANYL CITRATE (PF) 100 MCG/2ML IJ SOLN
INTRAMUSCULAR | Status: DC | PRN
Start: 2022-02-01 — End: 2022-02-01
  Administered 2022-02-01 (×2): 50 ug via INTRAVENOUS

## 2022-02-01 MED ORDER — BUPIVACAINE HCL (PF) 0.25 % IJ SOLN
INTRAMUSCULAR | Status: AC
  Filled 2022-02-01: qty 30

## 2022-02-01 MED ORDER — BACITRACIN 500 UNIT/GM EX OINT
TOPICAL_OINTMENT | CUTANEOUS | Status: DC | PRN
  Administered 2022-02-01: 1 via TOPICAL

## 2022-02-01 MED ORDER — PROPOFOL 10 MG/ML IV BOLUS
INTRAVENOUS | Status: DC | PRN
Start: 2022-02-01 — End: 2022-02-01
  Administered 2022-02-01: 30 mg via INTRAVENOUS

## 2022-02-01 MED ORDER — FENTANYL CITRATE (PF) 100 MCG/2ML IJ SOLN
INTRAMUSCULAR | Status: AC
  Filled 2022-02-01: qty 2

## 2022-02-01 MED ORDER — 0.9 % SODIUM CHLORIDE (POUR BTL) OPTIME
TOPICAL | Status: DC | PRN
  Administered 2022-02-01: 50 mL

## 2022-02-01 MED ORDER — MIDAZOLAM HCL 5 MG/5ML IJ SOLN
INTRAMUSCULAR | Status: DC | PRN
  Administered 2022-02-01: 2 mg via INTRAVENOUS

## 2022-02-01 MED ORDER — PROPOFOL 500 MG/50ML IV EMUL
INTRAVENOUS | Status: DC | PRN
  Administered 2022-02-01: 50 ug/kg/min via INTRAVENOUS

## 2022-02-01 MED ORDER — OXYCODONE HCL 5 MG/5ML PO SOLN: 5.0000 mg | Freq: Once | ORAL | Status: DC | PRN

## 2022-02-01 MED ORDER — CEFAZOLIN SODIUM-DEXTROSE 2-4 GM/100ML-% IV SOLN
2.0000 g | INTRAVENOUS | Status: AC
  Administered 2022-02-01: 2 g via INTRAVENOUS

## 2022-02-01 MED ORDER — OXYCODONE HCL 5 MG PO TABS: 5.0000 mg | ORAL_TABLET | Freq: Once | ORAL | Status: DC | PRN

## 2022-02-01 MED ORDER — BACITRACIN ZINC 500 UNIT/GM EX OINT
TOPICAL_OINTMENT | CUTANEOUS | Status: AC
  Filled 2022-02-01: qty 3.6

## 2022-02-01 SURGICAL SUPPLY — 41 items
APL PRP STRL LF DISP 70% ISPRP (MISCELLANEOUS) ×1
BLADE SURG 15 STRL LF DISP TIS (BLADE) ×1 IMPLANT
BLADE SURG 15 STRL SS (BLADE) ×2
BNDG CMPR 9X4 STRL LF SNTH (GAUZE/BANDAGES/DRESSINGS) ×1
BNDG COHESIVE 1X5 TAN STRL LF (GAUZE/BANDAGES/DRESSINGS) ×1 IMPLANT
BNDG ELASTIC 3X5.8 VLCR STR LF (GAUZE/BANDAGES/DRESSINGS) IMPLANT
BNDG ESMARK 4X9 LF (GAUZE/BANDAGES/DRESSINGS) ×2 IMPLANT
BNDG GAUZE ELAST 4 BULKY (GAUZE/BANDAGES/DRESSINGS) IMPLANT
CHLORAPREP W/TINT 26 (MISCELLANEOUS) ×2 IMPLANT
CORD BIPOLAR FORCEPS 12FT (ELECTRODE) ×2 IMPLANT
COVER BACK TABLE 60X90IN (DRAPES) ×2 IMPLANT
COVER MAYO STAND STRL (DRAPES) ×2 IMPLANT
CUFF TOURN SGL QUICK 18X4 (TOURNIQUET CUFF) IMPLANT
CUFF TOURN SGL QUICK 24 (TOURNIQUET CUFF)
CUFF TRNQT CYL 24X4X16.5-23 (TOURNIQUET CUFF) IMPLANT
DRAPE EXTREMITY T 121X128X90 (DISPOSABLE) ×2 IMPLANT
DRAPE SURG 17X23 STRL (DRAPES) ×1 IMPLANT
GAUZE SPONGE 4X4 12PLY STRL (GAUZE/BANDAGES/DRESSINGS) ×2 IMPLANT
GAUZE XEROFORM 1X8 LF (GAUZE/BANDAGES/DRESSINGS) ×2 IMPLANT
GLOVE SURG ENC MOIS LTX SZ7 (GLOVE) ×2 IMPLANT
GLOVE SURG UNDER POLY LF SZ7 (GLOVE) ×2 IMPLANT
GOWN STRL REUS W/ TWL LRG LVL3 (GOWN DISPOSABLE) ×1 IMPLANT
GOWN STRL REUS W/TWL LRG LVL3 (GOWN DISPOSABLE) ×2
GOWN STRL REUS W/TWL XL LVL3 (GOWN DISPOSABLE) ×2 IMPLANT
NDL HYPO 25X1 1.5 SAFETY (NEEDLE) IMPLANT
NEEDLE HYPO 25X1 1.5 SAFETY (NEEDLE) ×2 IMPLANT
NS IRRIG 1000ML POUR BTL (IV SOLUTION) ×2 IMPLANT
PACK BASIN DAY SURGERY FS (CUSTOM PROCEDURE TRAY) ×2 IMPLANT
PAD CAST 3X4 CTTN HI CHSV (CAST SUPPLIES) IMPLANT
PADDING CAST COTTON 3X4 STRL (CAST SUPPLIES)
SLEEVE SCD COMPRESS KNEE MED (STOCKING) IMPLANT
SUCTION FRAZIER HANDLE 10FR (MISCELLANEOUS)
SUCTION TUBE FRAZIER 10FR DISP (MISCELLANEOUS) IMPLANT
SUT ETHILON 4 0 PS 2 18 (SUTURE) ×2 IMPLANT
SUT MNCRL AB 3-0 PS2 18 (SUTURE) ×2 IMPLANT
SUT VICRYL 4-0 PS2 18IN ABS (SUTURE) IMPLANT
SYR BULB EAR ULCER 3OZ GRN STR (SYRINGE) ×2 IMPLANT
SYR CONTROL 10ML LL (SYRINGE) ×1 IMPLANT
TOWEL GREEN STERILE FF (TOWEL DISPOSABLE) ×4 IMPLANT
TUBE CONNECTING 20X1/4 (TUBING) ×2 IMPLANT
UNDERPAD 30X36 HEAVY ABSORB (UNDERPADS AND DIAPERS) ×2 IMPLANT

## 2022-02-01 NOTE — Anesthesia Preprocedure Evaluation (Signed)
Anesthesia Evaluation  Patient identified by MRN, date of birth, ID band Patient awake    Reviewed: Allergy & Precautions, H&P , NPO status , Patient's Chart, lab work & pertinent test results  Airway Mallampati: II  TM Distance: >3 FB Neck ROM: Full    Dental  (+) Teeth Intact, Dental Advisory Given   Pulmonary neg pulmonary ROS,    Pulmonary exam normal breath sounds clear to auscultation       Cardiovascular hypertension, Pt. on medications negative cardio ROS Normal cardiovascular exam Rhythm:Regular Rate:Normal     Neuro/Psych negative neurological ROS  negative psych ROS   GI/Hepatic negative GI ROS, Neg liver ROS,   Endo/Other  negative endocrine ROS  Renal/GU negative Renal ROS     Musculoskeletal Mucous cyst left middle finger   Abdominal (+) + obese,   Peds  Hematology negative hematology ROS (+)   Anesthesia Other Findings   Reproductive/Obstetrics                             Anesthesia Physical  Anesthesia Plan  ASA: 2  Anesthesia Plan: General   Post-op Pain Management:    Induction: Intravenous  PONV Risk Score and Plan: Treatment may vary due to age or medical condition, Ondansetron and Dexamethasone  Airway Management Planned: LMA  Additional Equipment: None  Intra-op Plan:   Post-operative Plan: Extubation in OR  Informed Consent: I have reviewed the patients History and Physical, chart, labs and discussed the procedure including the risks, benefits and alternatives for the proposed anesthesia with the patient or authorized representative who has indicated his/her understanding and acceptance.     Dental advisory given  Plan Discussed with: CRNA and Anesthesiologist  Anesthesia Plan Comments:         Anesthesia Quick Evaluation

## 2022-02-01 NOTE — Interval H&P Note (Signed)
History and Physical Interval Note:  02/01/2022 12:16 PM  Joshua Rivas  has presented today for surgery, with the diagnosis of Mucous cyst left middle finger.  The various methods of treatment have been discussed with the patient and family. After consideration of risks, benefits and other options for treatment, the patient has consented to  Procedure(s): REMOVAL MUCOUS CYST LEFT MIDDLE FINGER (Left) as a surgical intervention.  The patient's history has been reviewed, patient examined, no change in status, stable for surgery.  I have reviewed the patient's chart and labs.  Questions were answered to the patient's satisfaction.     Jowanna Loeffler Jionni Helming

## 2022-02-01 NOTE — Transfer of Care (Signed)
Immediate Anesthesia Transfer of Care Note  Patient: Shawnie Pons  Procedure(s) Performed: REMOVAL MUCOUS CYST LEFT MIDDLE FINGER (Left: Finger)  Patient Location: PACU  Anesthesia Type:MAC  Level of Consciousness: awake, alert  and oriented  Airway & Oxygen Therapy: Patient Spontanous Breathing and Patient connected to face mask oxygen  Post-op Assessment: Report given to RN and Post -op Vital signs reviewed and stable  Post vital signs: Reviewed and stable  Last Vitals:  Vitals Value Taken Time  BP    Temp    Pulse 79 02/01/22 1313  Resp    SpO2 94 % 02/01/22 1313  Vitals shown include unvalidated device data.  Last Pain:  Vitals:   02/01/22 1020  TempSrc: Oral  PainSc: 0-No pain      Patients Stated Pain Goal: 5 (80/03/49 1791)  Complications: No notable events documented.

## 2022-02-01 NOTE — Brief Op Note (Signed)
02/01/2022  1:10 PM  PATIENT:  Joshua Rivas  66 y.o. male  PRE-OPERATIVE DIAGNOSIS:  Mucous cyst left middle finger  POST-OPERATIVE DIAGNOSIS:  Mucous cyst left middle finger  PROCEDURE:  Procedure(s): REMOVAL MUCOUS CYST LEFT MIDDLE FINGER (Left)  SURGEON:  Surgeon(s) and Role:    * Sherilyn Cooter, MD - Primary  PHYSICIAN ASSISTANT:   ASSISTANTS: none   ANESTHESIA:   local and MAC  EBL:  3 mL   BLOOD ADMINISTERED:none  DRAINS: none   LOCAL MEDICATIONS USED:  BUPIVICAINE  and LIDOCAINE   SPECIMEN:  No Specimen  DISPOSITION OF SPECIMEN:  N/A  COUNTS:  YES  TOURNIQUET:  * Missing tourniquet times found for documented tourniquets in log: [id number] *  DICTATION: .Dragon Dictation  PLAN OF CARE: Discharge to home after PACU  PATIENT DISPOSITION:  PACU - hemodynamically stable.   Delay start of Pharmacological VTE agent (>24hrs) due to surgical blood loss or risk of bleeding: not applicable

## 2022-02-01 NOTE — Anesthesia Postprocedure Evaluation (Signed)
Anesthesia Post Note  Patient: Joshua Rivas  Procedure(s) Performed: REMOVAL MUCOUS CYST LEFT MIDDLE FINGER (Left: Finger)     Patient location during evaluation: SICU Anesthesia Type: MAC Level of consciousness: awake and alert and oriented Pain management: pain level controlled Vital Signs Assessment: post-procedure vital signs reviewed and stable Respiratory status: spontaneous breathing, nonlabored ventilation and respiratory function stable Cardiovascular status: stable and blood pressure returned to baseline Postop Assessment: no apparent nausea or vomiting Anesthetic complications: no   No notable events documented.  Last Vitals:  Vitals:   02/01/22 1314 02/01/22 1315  BP:    Pulse: 79 78  Resp: 13 13  Temp:    SpO2: 94% 96%    Last Pain:  Vitals:   02/01/22 1313  TempSrc:   PainSc: 0-No pain                 Joshua Rivas A.

## 2022-02-01 NOTE — Op Note (Signed)
° °  Date of Surgery: 02/01/2022  INDICATIONS: Mr. Joshua Rivas is a 66 y.o.-year-old male with recurrent left middle finger mucous cyst.  Risks, benefits, and alternatives to surgery were again discussed with the patient wishing to proceed with surgery.  Informed consent was signed after our discussion.   PREOPERATIVE DIAGNOSIS:  Left middle finger digital mucous cyst  POSTOPERATIVE DIAGNOSIS: Same.  PROCEDURE:  Excision of digital mucous cyst Debridement of distal interphalangeal joint osteophyte   SURGEON: Audria Nine, M.D.  ASSIST:   ANESTHESIA:  Local, MAC  IV FLUIDS AND URINE: See anesthesia.  ESTIMATED BLOOD LOSS: 3 mL.  IMPLANTS: * No implants in log *   DRAINS: None  COMPLICATIONS: see description of procedure.  DESCRIPTION OF PROCEDURE: The patient was met in the preoperative holding area where the surgical site was marked and the consent form was verified.  The patient was then taken to the operating room and transferred to the operating table.  All bony prominences were well padded.  A formal time-out was performed to confirm that this was the correct patient, surgery, side, and site. A local block was performed with 8cc of 1% lidocaine and 0.25% marcaine.  The operative extremity was prepped and draped in the usual and sterile fashion.    A tournicot was applied to the middle finger.  A longitudinal incision was made along the interval between the terminal extensor tendon and the collateral ligament.  A transverse incision was made along the DIP joint.  The skin was elevated as full thickness flaps.  The cyst was decompressed.  The interval between the terminal tendon and the collateral ligament was sharply excised.  This included the stalk of the cyst.  The DIP joint was identified.  The dorsal osteophytes were removed from the head of the middle phalanx and the base of the proximal phalanx.  This was done with both a rongeur and a small curette. Bipolar electrocautery was  used to coagulate any potential remnants of cyst origin from the joint.  The wound was then thoroughly irrigated.  It was closed using 4-0 nylon sutures.  The tournicot was removed and the wound dressed with xeroform, bacitracin ointment, 4x4, and loosely wrapped Coban.  The patient was then reversed from sedation and transferred to the postoperative bed.  All counts were correct at the end of the procedure. The patient was then taken to the PACU in stable condition.   POSTOPERATIVE PLAN: He will be discharged to home with appropriate pain medication and discharge instructions.   Audria Nine, MD 1:13 PM

## 2022-02-01 NOTE — Discharge Instructions (Addendum)
Joshua Rivas, M.D. Hand Surgery  POST-OPERATIVE DISCHARGE INSTRUCTIONS   PRESCRIPTIONS: You have been given a prescription to be taken as directed for post-operative pain control.  You may also take over the counter ibuprofen/aleve and tylenol for pain. Take this as directed on the packaging. Do not exceed 3000 mg tylenol/acetaminophen in 24 hours.  Ibuprofen 600-800 mg (3-4) tablets by mouth every 6 hours as needed for pain.  OR Aleve 2 tablets by mouth every 12 hours (twice daily) as needed for pain.  AND/OR Tylenol 1000 mg (2 tablets) every 8 hours as needed for pain.  Please use your pain medication carefully, as refills are limited and you may not be provided with one.  As stated above, please use over the counter pain medicine - it will also be helpful with decreasing your swelling.    ANESTHESIA: After your surgery, post-surgical discomfort or pain is likely. This discomfort can last several days to a few weeks. At certain times of the day your discomfort may be more intense.   Did you receive a nerve block?  A nerve block can provide pain relief for one hour to two days after your surgery. As long as the nerve block is working, you will experience little or no sensation in the area the surgeon operated on.  As the nerve block wears off, you will begin to experience pain or discomfort. It is very important that you begin taking your prescribed pain medication before the nerve block fully wears off. Treating your pain at the first sign of the block wearing off will ensure your pain is better controlled and more tolerable when full-sensation returns. Do not wait until the pain is intolerable, as the medicine will be less effective. It is better to treat pain in advance than to try and catch up.   General Anesthesia:  If you did not receive a nerve block during your surgery, you will need to start taking your pain medication shortly after your surgery and should continue to do  so as prescribed by your surgeon.     ICE AND ELEVATION: Elevation, as much as possible for the next 48 hours, is critical for decreasing swelling as well as for pain relief. Elevation means when you are seated or lying down, you hand should be at or above your heart. When walking, the hand needs to be at or above the level of your elbow.  If the bandage gets too tight, it may need to be loosened. Please contact our office and we will instruct you in how to do this.    SURGICAL BANDAGES:  Keep your dressing and/or splint clean and dry at all times.  Do not remove until you are seen again in the office.  If careful, you may place a plastic bag over your bandage and tape the end to shower, but be careful, do not get your bandages wet.     HAND THERAPY:  You may not need any. If you do, we will begin this at your follow up visit in the clinic.    ACTIVITY AND WORK: You are encouraged to move any fingers which are not in the bandage.  Light use of the fingers is allowed to assist the other hand with daily hygiene and eating, but strong gripping or lifting is often uncomfortable and should be avoided.  You might miss a variable period of time from work and hopefully this issue has been discussed prior to surgery. You may not do any  heavy work with your affected hand for about 2 weeks.    [facility] [address] [place],  [place]  [id number] [phone]  Post Anesthesia Home Care Instructions  Activity: Get plenty of rest for the remainder of the day. A responsible individual must stay with you for 24 hours following the procedure.  For the next 24 hours, DO NOT: -Drive a car -Paediatric nurse -Drink alcoholic beverages -Take any medication unless instructed by your physician -Make any legal decisions or sign important papers.  Meals: Start with liquid foods such as gelatin or soup. Progress to regular foods as tolerated. Avoid greasy, spicy, heavy foods. If  nausea and/or vomiting occur, drink only clear liquids until the nausea and/or vomiting subsides. Call your physician if vomiting continues.  Special Instructions/Symptoms: Your throat may feel dry or sore from the anesthesia or the breathing tube placed in your throat during surgery. If this causes discomfort, gargle with warm salt water. The discomfort should disappear within 24 hours.  If you had a scopolamine patch placed behind your ear for the management of post- operative nausea and/or vomiting:  1. The medication in the patch is effective for 72 hours, after which it should be removed.  Wrap patch in a tissue and discard in the trash. Wash hands thoroughly with soap and water. 2. You may remove the patch earlier than 72 hours if you experience unpleasant side effects which may include dry mouth, dizziness or visual disturbances. 3. Avoid touching the patch. Wash your hands with soap and water after contact with the patch.

## 2022-02-02 ENCOUNTER — Encounter (HOSPITAL_BASED_OUTPATIENT_CLINIC_OR_DEPARTMENT_OTHER): Payer: Self-pay | Admitting: Orthopedic Surgery

## 2022-02-09 ENCOUNTER — Encounter: Payer: 59 | Admitting: Orthopedic Surgery

## 2022-02-09 ENCOUNTER — Ambulatory Visit (INDEPENDENT_AMBULATORY_CARE_PROVIDER_SITE_OTHER): Payer: 59 | Admitting: Orthopedic Surgery

## 2022-02-09 ENCOUNTER — Other Ambulatory Visit: Payer: Self-pay

## 2022-02-09 ENCOUNTER — Encounter: Payer: Self-pay | Admitting: Orthopedic Surgery

## 2022-02-09 VITALS — BP 121/77 | HR 73

## 2022-02-09 DIAGNOSIS — M67449 Ganglion, unspecified hand: Secondary | ICD-10-CM

## 2022-02-09 NOTE — Progress Notes (Signed)
° °  Post-Op Visit Note   Patient: Joshua Rivas           Date of Birth: 1956/10/22           MRN: [id number] Visit Date: 02/09/2022 PCP: Georgann Housekeeper, MD   Assessment & Plan:  Chief Complaint:  Chief Complaint  Patient presents with   Right Middle Finger - Routine Post Op   Visit Diagnoses:  1. Digital mucous cyst of finger     Plan:  Patient doing well at 1 week s/p right middle finger mucous cyst excision.  Incision clean, dry, and well approximated.  Will see again next week for suture removal.   Follow-Up Instructions: No follow-ups on file.   Orders:  No orders of the defined types were placed in this encounter.  No orders of the defined types were placed in this encounter.   Imaging: No results found.  PMFS History: Patient Active Problem List   Diagnosis Date Noted   Digital mucous cyst of finger    Cholecystitis with cholelithiasis 10/11/2012   Groin cyst 04/08/2012   Past Medical History:  Diagnosis Date   Cyst    Left groin   Fecal impaction (HCC) 2010   Groin cyst 03/2012   left   Hypertension     History reviewed. No pertinent family history.  Past Surgical History:  Procedure Laterality Date   CHOLECYSTECTOMY  10/11/2012   Procedure: CHOLECYSTECTOMY;  Surgeon: Mariella Saa, MD;  Location: [facility];  Service: General;  Laterality: N/A;  laparoscopic to open cholecystectomy   CYST EXCISION Left 02/01/2022   Procedure: REMOVAL MUCOUS CYST LEFT MIDDLE FINGER;  Surgeon: Marlyne Beards, MD;  Location: [facility];  Service: Orthopedics;  Laterality: Left;   MASS EXCISION Left 01/02/2017   Procedure: EXCISION MASS left long finger and debridement distal interphalangeal joint;  Surgeon: Betha Loa, MD;  Location: [facility];  Service: Orthopedics;  Laterality: Left;   Social History   Occupational History   Not on file  Tobacco Use   Smoking status: Never   Smokeless tobacco: Never  Substance and Sexual  Activity   Alcohol use: Yes    Comment: occas   Drug use: No   Sexual activity: Never

## 2022-02-13 ENCOUNTER — Other Ambulatory Visit: Payer: Self-pay

## 2022-02-13 ENCOUNTER — Ambulatory Visit (INDEPENDENT_AMBULATORY_CARE_PROVIDER_SITE_OTHER): Payer: 59 | Admitting: Orthopedic Surgery

## 2022-02-13 DIAGNOSIS — M67449 Ganglion, unspecified hand: Secondary | ICD-10-CM

## 2022-02-13 NOTE — Progress Notes (Signed)
° °  Post-Op Visit Note   Patient: Joshua Rivas           Date of Birth: 1956/11/17           MRN: [id number] Visit Date: 02/13/2022 PCP: Georgann Housekeeper, MD   Assessment & Plan:  Chief Complaint:  Chief Complaint  Patient presents with   Left Middle Finger - Routine Post Op    Says that it is doing good   Visit Diagnoses:  1. Digital mucous cyst of finger     Plan: Patient is almost two weeks out from his left middle finger mucous cyst excision.  He is doing well.  Sutures removed today.  He can follow up again in a few weeks if he has an issues or concerns.   Follow-Up Instructions: No follow-ups on file.   Orders:  No orders of the defined types were placed in this encounter.  No orders of the defined types were placed in this encounter.   Imaging: No results found.  PMFS History: Patient Active Problem List   Diagnosis Date Noted   Digital mucous cyst of finger    Cholecystitis with cholelithiasis 10/11/2012   Groin cyst 04/08/2012   Past Medical History:  Diagnosis Date   Cyst    Left groin   Fecal impaction (HCC) 2010   Groin cyst 03/2012   left   Hypertension     No family history on file.  Past Surgical History:  Procedure Laterality Date   CHOLECYSTECTOMY  10/11/2012   Procedure: CHOLECYSTECTOMY;  Surgeon: Mariella Saa, MD;  Location: [facility];  Service: General;  Laterality: N/A;  laparoscopic to open cholecystectomy   CYST EXCISION Left 02/01/2022   Procedure: REMOVAL MUCOUS CYST LEFT MIDDLE FINGER;  Surgeon: Marlyne Beards, MD;  Location: [facility];  Service: Orthopedics;  Laterality: Left;   MASS EXCISION Left 01/02/2017   Procedure: EXCISION MASS left long finger and debridement distal interphalangeal joint;  Surgeon: Betha Loa, MD;  Location: [facility];  Service: Orthopedics;  Laterality: Left;   Social History   Occupational History   Not on file  Tobacco Use   Smoking status: Never   Smokeless  tobacco: Never  Substance and Sexual Activity   Alcohol use: Yes    Comment: occas   Drug use: No   Sexual activity: Never

## 2022-02-14 ENCOUNTER — Ambulatory Visit: Payer: 59 | Admitting: Orthopedic Surgery

## 2023-02-06 DIAGNOSIS — R7303 Prediabetes: Secondary | ICD-10-CM | POA: Diagnosis not present

## 2023-02-06 DIAGNOSIS — I10 Essential (primary) hypertension: Secondary | ICD-10-CM | POA: Diagnosis not present

## 2023-02-06 DIAGNOSIS — R69 Illness, unspecified: Secondary | ICD-10-CM | POA: Diagnosis not present

## 2023-02-06 DIAGNOSIS — E669 Obesity, unspecified: Secondary | ICD-10-CM | POA: Diagnosis not present

## 2023-02-06 DIAGNOSIS — Z6833 Body mass index (BMI) 33.0-33.9, adult: Secondary | ICD-10-CM | POA: Diagnosis not present

## 2023-02-06 DIAGNOSIS — Z1211 Encounter for screening for malignant neoplasm of colon: Secondary | ICD-10-CM | POA: Diagnosis not present

## 2023-03-07 DIAGNOSIS — R69 Illness, unspecified: Secondary | ICD-10-CM | POA: Diagnosis not present

## 2023-03-07 DIAGNOSIS — M7542 Impingement syndrome of left shoulder: Secondary | ICD-10-CM | POA: Diagnosis not present

## 2023-04-25 DIAGNOSIS — R69 Illness, unspecified: Secondary | ICD-10-CM | POA: Diagnosis not present

## 2023-07-26 DIAGNOSIS — Z0100 Encounter for examination of eyes and vision without abnormal findings: Secondary | ICD-10-CM | POA: Diagnosis not present

## 2023-08-14 DIAGNOSIS — Z23 Encounter for immunization: Secondary | ICD-10-CM | POA: Diagnosis not present

## 2023-08-14 DIAGNOSIS — I10 Essential (primary) hypertension: Secondary | ICD-10-CM | POA: Diagnosis not present

## 2023-08-14 DIAGNOSIS — R7303 Prediabetes: Secondary | ICD-10-CM | POA: Diagnosis not present

## 2023-08-14 DIAGNOSIS — J309 Allergic rhinitis, unspecified: Secondary | ICD-10-CM | POA: Diagnosis not present

## 2023-08-14 DIAGNOSIS — K760 Fatty (change of) liver, not elsewhere classified: Secondary | ICD-10-CM | POA: Diagnosis not present

## 2023-08-14 DIAGNOSIS — Z0000 Encounter for general adult medical examination without abnormal findings: Secondary | ICD-10-CM | POA: Diagnosis not present

## 2023-08-14 DIAGNOSIS — Z125 Encounter for screening for malignant neoplasm of prostate: Secondary | ICD-10-CM | POA: Diagnosis not present

## 2023-08-14 DIAGNOSIS — Z1389 Encounter for screening for other disorder: Secondary | ICD-10-CM | POA: Diagnosis not present

## 2023-08-14 DIAGNOSIS — K219 Gastro-esophageal reflux disease without esophagitis: Secondary | ICD-10-CM | POA: Diagnosis not present

## 2023-11-15 DIAGNOSIS — K635 Polyp of colon: Secondary | ICD-10-CM | POA: Diagnosis not present

## 2023-11-15 DIAGNOSIS — Z1211 Encounter for screening for malignant neoplasm of colon: Secondary | ICD-10-CM | POA: Diagnosis not present

## 2023-11-19 DIAGNOSIS — K635 Polyp of colon: Secondary | ICD-10-CM | POA: Diagnosis not present

## 2024-01-01 DIAGNOSIS — L821 Other seborrheic keratosis: Secondary | ICD-10-CM | POA: Diagnosis not present

## 2024-01-01 DIAGNOSIS — D485 Neoplasm of uncertain behavior of skin: Secondary | ICD-10-CM | POA: Diagnosis not present
